# Patient Record
Sex: Male | Born: 1970 | Race: White | Hispanic: No | Marital: Married | Smoking: Never smoker
Health system: Southern US, Community
[De-identification: ages and names within clinical notes are randomized; demographics above are authoritative.]

## PROBLEM LIST (undated history)

## (undated) DIAGNOSIS — E7800 Pure hypercholesterolemia, unspecified: Secondary | ICD-10-CM

## (undated) DIAGNOSIS — R109 Unspecified abdominal pain: Secondary | ICD-10-CM

## (undated) DIAGNOSIS — N200 Calculus of kidney: Secondary | ICD-10-CM

## (undated) DIAGNOSIS — M5126 Other intervertebral disc displacement, lumbar region: Secondary | ICD-10-CM

## (undated) DIAGNOSIS — K8070 Calculus of gallbladder and bile duct without cholecystitis without obstruction: Secondary | ICD-10-CM

## (undated) DIAGNOSIS — Z6841 Body Mass Index (BMI) 40.0 and over, adult: Secondary | ICD-10-CM

## (undated) DIAGNOSIS — J45909 Unspecified asthma, uncomplicated: Secondary | ICD-10-CM

## (undated) DIAGNOSIS — K760 Fatty (change of) liver, not elsewhere classified: Secondary | ICD-10-CM

## (undated) DIAGNOSIS — K219 Gastro-esophageal reflux disease without esophagitis: Secondary | ICD-10-CM

## (undated) DIAGNOSIS — K2100 Gastro-esophageal reflux disease with esophagitis, without bleeding: Secondary | ICD-10-CM

## (undated) DIAGNOSIS — F32A Depression, unspecified: Secondary | ICD-10-CM

## (undated) DIAGNOSIS — Z87442 Personal history of urinary calculi: Secondary | ICD-10-CM

## (undated) DIAGNOSIS — D120 Benign neoplasm of cecum: Secondary | ICD-10-CM

## (undated) DIAGNOSIS — E119 Type 2 diabetes mellitus without complications: Secondary | ICD-10-CM

## (undated) DIAGNOSIS — G4733 Obstructive sleep apnea (adult) (pediatric): Secondary | ICD-10-CM

## (undated) DIAGNOSIS — J309 Allergic rhinitis, unspecified: Secondary | ICD-10-CM

## (undated) DIAGNOSIS — F329 Major depressive disorder, single episode, unspecified: Secondary | ICD-10-CM

## (undated) DIAGNOSIS — I10 Essential (primary) hypertension: Secondary | ICD-10-CM

## (undated) DIAGNOSIS — E669 Obesity, unspecified: Secondary | ICD-10-CM

## (undated) HISTORY — DX: Allergic rhinitis, unspecified: J30.9

## (undated) HISTORY — DX: Type 2 diabetes mellitus without complications: E11.9

## (undated) HISTORY — DX: Unspecified abdominal pain: R10.9

## (undated) HISTORY — PX: EXTRACORPOREAL SHOCK WAVE LITHOTRIPSY: SHX1557

## (undated) HISTORY — DX: Essential (primary) hypertension: I10

## (undated) HISTORY — DX: Pure hypercholesterolemia, unspecified: E78.00

## (undated) HISTORY — DX: Personal history of urinary calculi: Z87.442

## (undated) HISTORY — DX: Benign neoplasm of cecum: D12.0

## (undated) HISTORY — DX: Calculus of gallbladder and bile duct without cholecystitis without obstruction: K80.70

## (undated) HISTORY — DX: Major depressive disorder, single episode, unspecified: F32.9

## (undated) HISTORY — DX: Fatty (change of) liver, not elsewhere classified: K76.0

## (undated) HISTORY — DX: Other intervertebral disc displacement, lumbar region: M51.26

## (undated) HISTORY — DX: Depression, unspecified: F32.A

## (undated) HISTORY — DX: Body Mass Index (BMI) 40.0 and over, adult: Z684

## (undated) HISTORY — DX: Gastro-esophageal reflux disease with esophagitis, without bleeding: K21.00

## (undated) HISTORY — DX: Obstructive sleep apnea (adult) (pediatric): G47.33

## (undated) HISTORY — DX: Gastro-esophageal reflux disease with esophagitis: K21.0

## (undated) HISTORY — DX: Obesity, unspecified: E66.9

---

## 2009-08-14 ENCOUNTER — Encounter: Admission: RE | Admit: 2009-08-14 | Discharge: 2009-08-14 | Payer: Self-pay | Admitting: Family Medicine

## 2010-02-14 ENCOUNTER — Emergency Department (HOSPITAL_BASED_OUTPATIENT_CLINIC_OR_DEPARTMENT_OTHER): Admission: EM | Admit: 2010-02-14 | Discharge: 2010-02-14 | Payer: Self-pay | Admitting: Emergency Medicine

## 2010-03-04 ENCOUNTER — Emergency Department (HOSPITAL_BASED_OUTPATIENT_CLINIC_OR_DEPARTMENT_OTHER): Admission: EM | Admit: 2010-03-04 | Discharge: 2010-03-04 | Payer: Self-pay | Admitting: Emergency Medicine

## 2010-09-04 ENCOUNTER — Ambulatory Visit (HOSPITAL_COMMUNITY)
Admission: RE | Admit: 2010-09-04 | Discharge: 2010-09-04 | Payer: Self-pay | Source: Home / Self Care | Attending: Urology | Admitting: Urology

## 2010-09-04 LAB — GLUCOSE, CAPILLARY: Glucose-Capillary: 146 mg/dL — ABNORMAL HIGH (ref 70–99)

## 2010-09-04 LAB — BASIC METABOLIC PANEL
BUN: 11 mg/dL (ref 6–23)
CO2: 29 mEq/L (ref 19–32)
Calcium: 9.5 mg/dL (ref 8.4–10.5)
Chloride: 102 mEq/L (ref 96–112)
GFR calc Af Amer: >60 mL/min (ref >60)
Glucose, Bld: 147 mg/dL — ABNORMAL HIGH (ref 70–99)

## 2010-09-04 LAB — CBC
HCT: 41.1 % (ref 39.0–52.0)
Hemoglobin: 14.5 g/dL (ref 13.0–17.0)
Platelets: 195 10*3/uL (ref 150–400)
WBC: 6.9 10*3/uL (ref 4.0–10.5)

## 2014-08-09 ENCOUNTER — Encounter: Payer: Self-pay | Admitting: *Deleted

## 2014-08-13 ENCOUNTER — Ambulatory Visit (INDEPENDENT_AMBULATORY_CARE_PROVIDER_SITE_OTHER): Payer: 59 | Admitting: Neurology

## 2014-08-13 ENCOUNTER — Encounter: Payer: Self-pay | Admitting: Neurology

## 2014-08-13 VITALS — BP 130/90 | HR 81 | Resp 14 | Ht 70.25 in | Wt 299.0 lb

## 2014-08-13 DIAGNOSIS — G4733 Obstructive sleep apnea (adult) (pediatric): Secondary | ICD-10-CM

## 2014-08-13 DIAGNOSIS — E1129 Type 2 diabetes mellitus with other diabetic kidney complication: Secondary | ICD-10-CM

## 2014-08-13 DIAGNOSIS — Z9989 Dependence on other enabling machines and devices: Principal | ICD-10-CM

## 2014-08-13 DIAGNOSIS — E662 Morbid (severe) obesity with alveolar hypoventilation: Secondary | ICD-10-CM

## 2014-08-13 NOTE — Progress Notes (Addendum)
SLEEP MEDICINE CLINIC   Provider:  Larey Seat, M D  Referring Provider: No ref. provider found Primary Care Physician:  Phineas Inches, MD  Chief Complaint  Patient presents with  . NP [facility] Sleep Consult    Rm 10, alone    HPI:  Ivan Allison is a 44 y.o. male , married , full time [employer] broker.   He is seen here as a referral from Dr. Coletta Memos for a sleep apnea discussion.   [name] is a right-handed Caucasian gentleman that I saw last about 10 years ago. He was diagnosed with obstructive sleep apnea and given the same machine he is still using a ResMed escape device. He states that his diabetes has been treated in a different ways and he is losing some weight. He was already obese 10 years ago. He has not had any problems with CPAP use but not long ago his machine just switched off in the middle of the night. He has literally use that almost every night for the last 10 years. His compliance has been excellent. He would like however to update the CPAP therapy in his review of systems today he said that he still feels like he has decreased too much sleep that he is fatigued and that this may be not just related to apnea but to other conditions he's diagnosed with such as diabetes, hypertension, hypercholesterolemia and depression. He has no surgical history of any kind. And his medication list was already reviewed and will be part of this note in the next chapter. He does have occasional allergic rhinitis and takes Flonase. He endorsed today the Epworth sleepiness score at 6 points and the fatigue severity scale at 38 points.  He tends to go to bed late , about midnight, and promptly goes to sleep,  Sleeps for  9-11 hours if not woken. Since being on invokamet has more nocturia.   He relies on an alarm, feels not rested in relation to hours of sleep. For school he gets up at 7.30 AM .  Her therapy changed from a FFM and he likes a nasal pillow, P 10 Airfit.  Auto-set range is  unchanged.   The patient's download was obtained today in office and it shows for the last 30 days and AHI of 8.3, average daily usage of 9 hours 19 minutes 100% compliance for days and 4 days over 4 hours of use. Time in apnea was only 0.1% air leak at the 91st percentile was 33.6 L/m pressure at the 95th percentile was 7.4 cm water machine is set between 4 and 12 cm as the Ultracet. The residual AHI however is a little on the high side and may contribute to fatigue and sleepiness for this reason we will order a split study study to establish the new baseline apnea index and to treat accordingly.   Review of Systems: Out of a complete 14 system review, the patient complains of only the following symptoms, and all other reviewed systems are negative. Sleepiness, fatigue, snoring if not on CPAP. Obesity, Nocturia times 1-2.  Hydration is variable.   Epworth score 8 , Fatigue severity score 38  , depression score is not obtained.    History   Social History  . Marital Status: Married    Spouse Name: N/A    Number of Children: N/A  . Years of Education: N/A   Occupational History  . Not on file.   Social History Main Topics  . Smoking status: Never Smoker   .  Smokeless tobacco: Not on file  . Alcohol Use: 0.0 oz/week    0 Not specified per week     Comment: rarely  . Drug Use: No  . Sexual Activity: Not on file   Other Topics Concern  . Not on file   Social History Narrative   Caffeine 1 cup avg daily.   Student/[employer], Working on [place].  Married, no kids.      History reviewed. No pertinent family history.  Past Medical History  Diagnosis Date  . Hypertension   . Diabetes mellitus without complication   . Hypercholesterolemia   . OSA (obstructive sleep apnea)   . Reflux esophagitis   . Depression   . BMI 40.0-44.9, adult   . Displacement of lumbar intervertebral disc     History reviewed. No pertinent past surgical history.  Current Outpatient  Prescriptions  Medication Sig Dispense Refill  . ALPRAZolam (XANAX) 0.5 MG tablet Take 0.5 mg by mouth at bedtime as needed. For sleep    . aspirin EC 81 MG tablet Take 81 mg by mouth daily.    Marland Kitchen atorvastatin (LIPITOR) 40 MG tablet Take 40 mg by mouth daily.    Marland Kitchen buPROPion (WELLBUTRIN XL) 150 MG 24 hr tablet Take 150 mg by mouth daily.    . Cetirizine HCl (ZYRTEC ALLERGY) 10 MG CAPS Take 10 mg by mouth daily.    . cyclobenzaprine (FLEXERIL) 5 MG tablet Take 5 mg by mouth at bedtime as needed.    . diphenoxylate-atropine (LOMOTIL) 2.5-0.025 MG per tablet Take 1 tablet by mouth 4 (four) times daily as needed.    Marland Kitchen escitalopram (LEXAPRO) 10 MG tablet Take 10 mg by mouth daily.    . fluticasone (FLONASE) 50 MCG/ACT nasal spray Place 2 sprays into both nostrils daily.    Marland Kitchen lisinopril-hydrochlorothiazide (PRINZIDE,ZESTORETIC) 20-12.5 MG per tablet Take 2 tablets by mouth daily.    . meloxicam (MOBIC) 15 MG tablet Take 15 mg by mouth daily as needed for pain.    Marland Kitchen metformin (FORTAMET) 1000 MG (OSM) 24 hr tablet Take 1,000 mg by mouth 2 (two) times daily.    . montelukast (SINGULAIR) 10 MG tablet Take 10 mg by mouth daily.    . Multiple Vitamin (MULTIVITAMIN) tablet Take 1 tablet by mouth daily.    . Potassium Citrate 15 MEQ (1620 MG) TBCR Take 10 mEq by mouth daily.    . Testosterone (ANDROGEL PUMP) 20.25 MG/ACT (1.62%) GEL 5 g once daily.    Marland Kitchen Dexlansoprazole (DEXILANT) 30 MG capsule Take 30 mg by mouth daily.     No current facility-administered medications for this visit.    Allergies as of 08/13/2014 - Review Complete 08/13/2014  Allergen Reaction Noted  . Sulfamethoxazole-trimethoprim Hives 08/09/2014    Vitals: BP 130/90 mmHg  Pulse 81  Resp 14  Ht 5' 10.25" (1.784 m)  Wt 299 lb (135.626 kg)  BMI 42.61 kg/m2 Last Weight:  Wt Readings from Last 1 Encounters:  08/13/14 299 lb (135.626 kg)       Last Height:   Ht Readings from Last 1 Encounters:  08/13/14 5' 10.25" (1.784 m)     Physical exam:  General: The patient is awake, alert and appears not in acute distress. The patient is well groomed. Head: Normocephalic, atraumatic. Neck is supple. Mallampati 4 , macroglossia.   neck circumference: 18 . Nasal airflow restricted,TMJ is not evident. Retrognathia is not seen.  Cardiovascular:  Regular rate and rhythm , without  murmurs or carotid  bruit, and without distended neck veins. Respiratory: Lungs are clear to auscultation. Skin:  Without evidence of edema, or rash Trunk: BMI is elevated and patient  has normal posture.  Neurologic exam : The patient is awake and alert, oriented to place and time.   Memory subjective described as intact.  There is a normal attention span & concentration ability.  Speech is fluent without dysarthria, dysphonia or aphasia.  Mood and affect are appropriate.  Cranial nerves: Pupils are equal and briskly reactive to light. Funduscopic exam without  evidence of pallor or edema.  Extraocular movements  in vertical and horizontal planes intact and without nystagmus. Visual fields by finger perimetry are intact. Hearing to finger rub intact.  Facial sensation intact to fine touch. Facial motor strength is symmetric and tongue and uvula move midline.  Motor exam:  Normal tone, muscle bulk and symmetric, strength in all extremities.  Sensory:  Fine touch, pinprick and vibration were tested in all extremities.  Proprioception is tested in the upper extremities only. This was normal.  Coordination: Rapid alternating movements in the fingers/hands is normal.  Finger-to-nose maneuver  normal without evidence of ataxia, dysmetria or tremor.  Gait and station: Patient walks without assistive device and is able unassisted to climb up to the exam table. Strength within normal limits.  Stance is stable and normal. Tandem gait is unfragmented. Romberg testing is negative.  Deep tendon reflexes: in the  upper and lower extremities are  symmetric and intact.  Babinski maneuver response is downgoing.   Assessment:  After physical and neurologic examination, review of laboratory studies, imaging, neurophysiology testing and pre-existing records, assessment is   1) OSA with CPAP use, highly compliant. 45 year old machine now unreliable. Autoset machine.  2) obesity - no headaches , no eye pressure, had both before CPAP use.  Nocturia- related to diabetes.   The patient was advised of the nature of the diagnosed sleep disorder, the treatment options and risks for general a health and wellness arising from not treating the condition. Visit duration was 45  minutes.   Plan:  Treatment plan and additional workup :  1) Repeat SPLIT study with the ultimate goal to get a respironics dream work machine with autoset function.  2) [employer] patient 4% AHI spit at 15, CO2 .       Asencion Partridge Geraldine Tesar MD  08/13/2014

## 2014-08-13 NOTE — Patient Instructions (Signed)
Polysomnography (Sleep Studies) Polysomnography (PSG) is a series of tests used for detecting (diagnosing) obstructive sleep apnea and other sleep disorders. The tests measure how some parts of your body are working while you are sleeping. The tests are extensive and expensive. They are done in a sleep lab or hospital, and vary from center to center. Your caregiver may perform other more simple sleep studies and questionnaires before doing more complete and involved testing. Testing may not be covered by insurance. Some of these tests are:  An EEG (Electroencephalogram). This tests your brain waves and stages of sleep.  An EOG (Electrooculogram). This measures the movements of your eyes. It detects periods of REM (rapid eye movement) sleep, which is your dream sleep.  An EKG (Electrocardiogram). This measures your heart rhythm.  EMG (Electromyography). This is a measurement of how the muscles are working in your upper airway and your legs while sleeping.  An oximetry measurement. It measures how much oxygen (air) you are getting while sleeping.  Breathing efforts may be measured. The same test can be interpreted (understood) differently by different caregivers and centers that study sleep.  Studies may be given an apnea/hypopnea index (AHI). This is a number which is found by counting the times of no breathing or under breathing during the night, and relating those numbers to the amount of time spent in bed. When the AHI is greater than 15, the patient is likely to complain of daytime sleepiness. When the AHI is greater than 30, the patient is at increased risk for heart problems and must be followed more closely. Following the AHI also allows you to know how treatment is working. Simple oximetry (tracking the amount of oxygen that is taken in) can be used for screening patients who:  Do not have symptoms (problems) of OSA.  Have a normal Epworth Sleepiness Scale Score.  Have a low pre-test  probability of having OSA.  Have none of the upper airway problems likely to cause apnea.  Oximetry is also used to determine if treatment is effective in patients who showed significant desaturations (not getting enough oxygen) on their home sleep study. One extra measure of safety is to perform additional studies for the person who only snores. This is because no one can predict with absolute certainty who will have OSA. Those who show significant desaturations (not getting enough oxygen) are recommended to have a more detailed sleep study. Document Released: 01/31/2003 Document Revised: 10/19/2011 Document Reviewed: 10/02/2013 ExitCare Patient Information 2015 [employer]. This information is not intended to replace advice given to you by your health care provider. Make sure you discuss any questions you have with your health care provider.  

## 2014-08-15 ENCOUNTER — Ambulatory Visit (INDEPENDENT_AMBULATORY_CARE_PROVIDER_SITE_OTHER): Payer: 59 | Admitting: Neurology

## 2014-08-15 DIAGNOSIS — Z9989 Dependence on other enabling machines and devices: Principal | ICD-10-CM

## 2014-08-15 DIAGNOSIS — E1129 Type 2 diabetes mellitus with other diabetic kidney complication: Secondary | ICD-10-CM

## 2014-08-15 DIAGNOSIS — E662 Morbid (severe) obesity with alveolar hypoventilation: Secondary | ICD-10-CM

## 2014-08-15 DIAGNOSIS — G4733 Obstructive sleep apnea (adult) (pediatric): Secondary | ICD-10-CM

## 2014-08-16 NOTE — Sleep Study (Signed)
Please see the scanned sleep study interpretation located in the Procedure tab within the Chart Review section. 

## 2014-08-27 ENCOUNTER — Telehealth: Payer: Self-pay | Admitting: *Deleted

## 2014-08-27 NOTE — Telephone Encounter (Signed)
Patient calling for sleep lab results, which are not available, patient would either like to have an appointment or request a call with results.

## 2014-08-28 ENCOUNTER — Other Ambulatory Visit: Payer: Self-pay | Admitting: Neurology

## 2014-08-28 ENCOUNTER — Encounter: Payer: Self-pay | Admitting: Neurology

## 2014-08-28 ENCOUNTER — Telehealth: Payer: Self-pay | Admitting: *Deleted

## 2014-08-28 ENCOUNTER — Encounter: Payer: Self-pay | Admitting: *Deleted

## 2014-08-28 DIAGNOSIS — G4733 Obstructive sleep apnea (adult) (pediatric): Secondary | ICD-10-CM

## 2014-08-28 NOTE — Telephone Encounter (Signed)
Patient was contacted and provided the results of his split night sleep study.  Patient has a prior diagnosis of sleep apnea and is in need of new CPAP equipment.  Patient requested [employer] and was referred to their office.  The patient gave verbal permission to mail a copy of his test results.  Dr. Bernerd Limbo was faxed a copy of the report.   Patient instructed to contact our office 6-8 weeks post set up to schedule a follow up appointment.

## 2014-08-28 NOTE — Telephone Encounter (Signed)
Patient was contacted today with results and referred to DME for CPAP set up.

## 2014-10-22 ENCOUNTER — Encounter: Payer: Self-pay | Admitting: Neurology

## 2014-10-22 ENCOUNTER — Ambulatory Visit (INDEPENDENT_AMBULATORY_CARE_PROVIDER_SITE_OTHER): Payer: 59 | Admitting: Neurology

## 2014-10-22 VITALS — BP 129/83 | HR 78 | Resp 14 | Ht 69.0 in | Wt 305.6 lb

## 2014-10-22 DIAGNOSIS — Z9989 Dependence on other enabling machines and devices: Principal | ICD-10-CM

## 2014-10-22 DIAGNOSIS — G4733 Obstructive sleep apnea (adult) (pediatric): Secondary | ICD-10-CM | POA: Insufficient documentation

## 2014-10-22 NOTE — Progress Notes (Signed)
SLEEP MEDICINE CLINIC   Provider:  Larey Seat, M D  Referring Provider: Bernerd Limbo, MD Primary Care Physician:  Phineas Inches, MD  Chief Complaint  Patient presents with  . 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   He is seen here as a referral from Dr. Coletta Memos for a sleep apnea discussion.   Ivan Allison is a right-handed Caucasian gentleman that I saw last about 10 years ago. He was diagnosed with obstructive sleep apnea and given the same machine he is still using a ResMed escape device. He states that his diabetes has been treated in a different ways and he is losing some weight. He was already obese 10 years ago. He has not had any problems with CPAP use but not long ago his machine just switched off in the middle of the night. He has literally use that almost every night for the last 10 years. His compliance has been excellent. He would like however to update the CPAP therapy in his review of systems today he said that he still feels like he has decreased too much sleep that he is fatigued and that this may be not just related to apnea but to other conditions he's diagnosed with such as diabetes, hypertension, hypercholesterolemia and depression. He has no surgical history of any kind. And his medication list was already reviewed and will be part of this note in the next chapter. He does have occasional allergic rhinitis and takes Flonase. He endorsed today the Epworth sleepiness score at 6 points and the fatigue severity scale at 38 points.  He tends to go to bed late , about midnight, and promptly goes to sleep,  Sleeps for  9-11 hours if not woken. Since being on invokamet has more nocturia.   He relies on an alarm, feels not rested in relation to hours of sleep. For school he gets up at 7.30 AM .  Her therapy changed from a FFM and he likes a nasal pillow, P 10 Airfit.  Auto-set range is unchanged.  The patient's  download was obtained today in office and it shows for the last 30 days and AHI of 8.3, average daily usage of 9 hours 19 minutes 100% compliance for days and 4 days over 4 hours of use. Time in apnea was only 0.1% air leak at the 91st percentile was 33.6 L/m pressure at the 95th percentile was 7.4 cm water machine is set between 4 and 12 cm as the Ultracet. The residual AHI however is a little on the high side and may contribute to fatigue and sleepiness for this reason we will order a split study study to establish the new baseline apnea index and to treat accordingly.   Interval history Ivan Allison underwent a new titration study :  Dated 08-15-14. The patient had been diagnosed with obstructive sleep apnea apnea in the past and was occurrence CPAP user. Interestingly for someone was used to the CPAP he had a sleep latency of 145 minutes. His AHI was 37.2 RDI 38.9 no REM sleep was seen in the diagnostic study. CPAP was initiated at 5 cm water and step-by-step increased to 13. It appeared that 12 cm water was best tolerated but there was not a lot of time witnessed under that pressure setting. For this reason the patient was prescribed a CPAP of 10 by ResMed. 12 cm water pressure 3  cm EPR or flex function and an air-fluid P 10 mask. He likes to set the machine is quiet he likes it looks and its handedness he has a heated holes which avoids conization water collection and the interface is very comfortable to him. The patient does have facial hair.  Compliance report for the last 30 days until 10-18-14 the patient used to machine 100% of all nights and 100% of all night over 4 hours consecutively. His average usage is 8 hours 47 minutes, the machine is set between 8 and 12 cm water pressure as an AutoSet with 2 cm EPR and his AHI is 2.7. He does not have major air leaks is 91st percentile pressure is around 11 cm I will not set the machine is not on have to. Today's Epworth sleepiness score was endorsed at 2  points and fatigue severity score at 22 points both are significantly reduced pre-study Epworth was 8 points and fatigue was 38 points.  Review of Systems: Out of a complete 14 system review, the patient complains of only the following symptoms, and all other reviewed systems are negative. Sleepiness, fatigue, snoring if not on CPAP.  Obesity, Nocturia times : on invokana ,  Zero to one time at night   Epworth score 2 from 8 , Fatigue severity score 22 from  38  , depression score is  2.    History   Social History  . Marital Status: Married    Spouse Name: N/A  . Number of Children: N/A  . Years of Education: N/A   Occupational History  . Not on file.   Social History Main Topics  . Smoking status: Never Smoker   . Smokeless tobacco: Not on file  . Alcohol Use: 0.0 oz/week    0 Standard drinks or equivalent per week     Comment: rarely  . Drug Use: No  . Sexual Activity: Not on file   Other Topics Concern  . Not on file   Social History Narrative   Caffeine 1 cup avg daily.   Student/[employer], Working on [place].  Married, no kids.      History reviewed. No pertinent family history.  Past Medical History  Diagnosis Date  . Hypertension   . Diabetes mellitus without complication   . Hypercholesterolemia   . OSA (obstructive sleep apnea)   . Reflux esophagitis   . Depression   . BMI 40.0-44.9, adult   . Displacement of lumbar intervertebral disc     History reviewed. No pertinent past surgical history. Vitals: BP 129/83 mmHg  Pulse 78  Resp 14  Ht 5\' 9"  (1.753 m)  Wt 305 lb 9.6 oz (138.619 kg)  BMI 45.11 kg/m2 Last Weight:  Wt Readings from Last 1 Encounters:  10/22/14 305 lb 9.6 oz (138.619 kg)       Last Height:   Ht Readings from Last 1 Encounters:  10/22/14 5\' 9"  (1.753 m)    Physical exam:  General: The patient is awake, alert and appears not in acute distress. The patient is well groomed. Head: Normocephalic, atraumatic. Neck is supple.  Mallampati 4, macroglossia.   Facial hair.  neck circumference: 18 . Nasal airflow not longer  Restricted, has flonase.  TMJ is not evident.  Retrognathia is not seen.  Cardiovascular:  Regular rate and rhythm , without  murmurs or carotid bruit, and without distended neck veins. Respiratory: Lungs are clear to auscultation. Skin:  Without evidence of edema, or rash Trunk: BMI is  elevated /  normal posture.  Neurologic exam : The patient is awake and alert, oriented to place and time.   Memory subjective described as intact.  There is a normal attention span & concentration ability.  Speech is fluent without dysarthria, dysphonia or aphasia.  Mood and affect are appropriate.  Cranial nerves: Pupils are equal and briskly reactive to light. Hearing to finger rub intact.  Facial sensation intact to fine touch. Facial motor strength is symmetric and tongue and uvula move midline.  Motor exam:  Normal tone, muscle bulk and symmetric, strength in all extremities.  Sensory:  Fine touch, pinprick and vibration were tested in all extremities.  Proprioception is tested in the upper extremities only. This was normal.  Coordination: Rapid alternating movements in the fingers/hands is normal.  Finger-to-nose maneuver  normal without evidence of ataxia, dysmetria or tremor.  Gait and station: Patient walks without assistive device and is able unassisted to climb up to the exam table. Strength within normal limits.  Stance is stable and normal. Tandem gait is unfragmented. Romberg testing is negative.  Deep tendon reflexes: in the  upper and lower extremities are symmetric and intact.  Babinski maneuver response is downgoing.   Assessment:  After physical and neurologic examination, review of laboratory studies, imaging, neurophysiology testing and pre-existing records, assessment is   1) OSA with CPAP use, highly compliant. New machine and interface P 10 comfortable  Autoset machine 8-12 , 95%  pressure is 11 cm water. Marland Kitchen  2) obesity -  3)Nocturia- residual zero to one related to diabetes. He has 2-3 before , and this improved  under CPAP.   The patient was advised of the nature of the diagnosed sleep disorder, the treatment options and risks for general a health and wellness arising from not treating the condition. Visit duration was 45  minutes.   Plan:  Treatment plan and additional workup :  1) [employer] patient with APRIA, on S 10 ResMed with airfit p10 , follow up yearly.   Asencion Partridge Anjolaoluwa Siguenza MD  10/22/2014

## 2014-10-22 NOTE — Patient Instructions (Signed)

## 2014-10-23 ENCOUNTER — Encounter: Payer: Self-pay | Admitting: Neurology

## 2014-11-30 ENCOUNTER — Encounter: Payer: Self-pay | Admitting: Neurology

## 2015-05-20 ENCOUNTER — Other Ambulatory Visit: Payer: Self-pay | Admitting: Surgical Oncology

## 2015-05-20 DIAGNOSIS — K219 Gastro-esophageal reflux disease without esophagitis: Secondary | ICD-10-CM

## 2015-05-20 DIAGNOSIS — E119 Type 2 diabetes mellitus without complications: Secondary | ICD-10-CM

## 2015-05-20 DIAGNOSIS — G4733 Obstructive sleep apnea (adult) (pediatric): Secondary | ICD-10-CM

## 2015-05-20 DIAGNOSIS — I159 Secondary hypertension, unspecified: Secondary | ICD-10-CM

## 2015-05-22 ENCOUNTER — Ambulatory Visit
Admission: RE | Admit: 2015-05-22 | Discharge: 2015-05-22 | Disposition: A | Discharge status: Home / Self Care | Payer: 59 | Source: Ambulatory Visit | Attending: Surgical Oncology | Admitting: Surgical Oncology

## 2015-05-22 DIAGNOSIS — K219 Gastro-esophageal reflux disease without esophagitis: Secondary | ICD-10-CM

## 2015-05-22 DIAGNOSIS — G4733 Obstructive sleep apnea (adult) (pediatric): Secondary | ICD-10-CM

## 2015-05-22 DIAGNOSIS — E119 Type 2 diabetes mellitus without complications: Secondary | ICD-10-CM

## 2015-05-22 DIAGNOSIS — I159 Secondary hypertension, unspecified: Secondary | ICD-10-CM

## 2015-10-22 ENCOUNTER — Ambulatory Visit (INDEPENDENT_AMBULATORY_CARE_PROVIDER_SITE_OTHER): Payer: 59 | Admitting: Neurology

## 2015-10-22 ENCOUNTER — Encounter: Payer: Self-pay | Admitting: Neurology

## 2015-10-22 VITALS — BP 122/82 | HR 88 | Resp 20 | Ht 69.0 in | Wt 288.0 lb

## 2015-10-22 DIAGNOSIS — E089 Diabetes mellitus due to underlying condition without complications: Secondary | ICD-10-CM

## 2015-10-22 DIAGNOSIS — G4733 Obstructive sleep apnea (adult) (pediatric): Secondary | ICD-10-CM

## 2015-10-22 DIAGNOSIS — Z9989 Dependence on other enabling machines and devices: Principal | ICD-10-CM

## 2015-10-22 NOTE — Progress Notes (Signed)
[facility]   Provider:  Larey Seat, M D  Referring Provider: Bernerd Limbo, MD Primary Care Physician:  Phineas Inches, MD  Chief Complaint  Patient presents with  . Follow-up    cpap, going well, uses apria, rm 11, alone    HPI:  Ivan Allison is a 45 y.o. male , married , full time [employer].   He is seen here as a referral from Dr. Coletta Memos for a sleep apnea discussion.   [name] is a right-handed Caucasian gentleman that I saw last about 10 years ago. He was diagnosed with obstructive sleep apnea and given the same machine he is still using a ResMed escape device. He states that his diabetes has been treated in a different ways and he is losing some weight. He was already obese 10 years ago. He has not had any problems with CPAP use but not long ago his machine just switched off in the middle of the night. He has literally use that almost every night for the last 10 years. His compliance has been excellent. He would like however to update the CPAP therapy in his review of systems today he said that he still feels like he has decreased too much sleep that he is fatigued and that this may be not just related to apnea but to other conditions he's diagnosed with such as diabetes, hypertension, hypercholesterolemia and depression. He has no surgical history of any kind. And his medication list was already reviewed and will be part of this note in the next chapter. He does have occasional allergic rhinitis and takes Flonase. He endorsed today the Epworth sleepiness score at 6 points and the fatigue severity scale at 38 points.  He tends to go to bed late , about midnight, and promptly goes to sleep,  Sleeps for  9-11 hours if not woken. Since being on invokamet has more nocturia.   He relies on an alarm, feels not rested in relation to hours of sleep. For school he gets up at 7.30 AM .  Her therapy changed from a FFM and he likes a nasal pillow, P 10 Airfit.  Auto-set range  is unchanged.  The patient's download was obtained today in office and it shows for the last 30 days and AHI of 8.3, average daily usage of 9 hours 19 minutes 100% compliance for days and 4 days over 4 hours of use. Time in apnea was only 0.1% air leak at the 91st percentile was 33.6 L/m pressure at the 95th percentile was 7.4 cm water machine is set between 4 and 12 cm as the Ultracet. The residual AHI however is a little on the high side and may contribute to fatigue and sleepiness for this reason we will order a split study study to establish the new baseline apnea index and to treat accordingly.   Interval history [name] underwent a new titration study :  Dated 08-15-14. The patient had been diagnosed with obstructive sleep apnea apnea in the past and was occurrence CPAP user. Interestingly for someone was used to the CPAP he had a sleep latency of 145 minutes. His AHI was 37.2 RDI 38.9 no REM sleep was seen in the diagnostic study. CPAP was initiated at 5 cm water and step-by-step increased to 13. It appeared that 12 cm water was best tolerated but there was not a lot of time witnessed under that pressure setting. For this reason the patient was prescribed a CPAP of 10 by ResMed. 12  cm water pressure 3 cm EPR or flex function and an air-fluid P 10 mask. He likes to set the machine is quiet he likes it looks and its handedness he has a heated holes which avoids conization water collection and the interface is very comfortable to him. The patient does have facial hair.  Compliance report for the last 30 days until 10-18-14 the patient used to machine 100% of all nights and 100% of all night over 4 hours consecutively. His average usage is 8 hours 47 minutes, the machine is set between 8 and 12 cm water pressure as an AutoSet with 2 cm EPR and his AHI is 2.7. He does not have major air leaks is 91st percentile pressure is around 11 cm I will not set the machine is not on have to. Today's Epworth  sleepiness score was endorsed at 2 points and fatigue severity score at 22 points both are significantly reduced pre-study Epworth was 8 points and fatigue was 38 points.   10-22-15 [name] underwent a gastric sleeve surgery with Dr. Toney Rakes and is doing well, her husband, my patient, is taken with the idea to undergo a similar procedure. He is using Invokana and lost 12 pounds.  [name] has used his machine 100% of the time over 4 hours every day of the last 30 days. Compliance is therefore 100% average user time 8 hours 37 minutes, he is using an AutoSet between 8 and 12 cm water with 2 cm EPR. Residual AHI is 2.0 there is no major air leak the 95th 02 percentile pressure is 11.7 cm water.     Review of Systems: Out of a complete 14 system review, the patient complains of only the following symptoms, and all other reviewed systems are negative. Sleepiness, fatigue, snoring if not on CPAP.  Obesity, Nocturia times : on invokana ,  Zero to one time at night   Epworth score 2 from 2 ,  Fatigue severity score 22 from  38  , depression score is  2.    Social History   Social History  . Marital Status: Married    Spouse Name: N/A  . Number of Children: N/A  . Years of Education: N/A   Occupational History  . Not on file.   Social History Main Topics  . Smoking status: Never Smoker   . Smokeless tobacco: Not on file  . Alcohol Use: 0.0 oz/week    0 Standard drinks or equivalent per week     Comment: rarely  . Drug Use: No  . Sexual Activity: Not on file   Other Topics Concern  . Not on file   Social History Narrative   Caffeine 1 cup avg daily.   Student/[employer], Working on [employer].  Married, no kids.      History reviewed. No pertinent family history.  Past Medical History  Diagnosis Date  . Hypertension   . Diabetes mellitus without complication ([place])   . Hypercholesterolemia   . OSA (obstructive sleep apnea)   . Reflux esophagitis   . Depression   .  BMI 40.0-44.9, adult ([place])   . Displacement of lumbar intervertebral disc     History reviewed. No pertinent past surgical history. Vitals: BP 122/82 mmHg  Pulse 88  Resp 20  Ht 5\' 9"  (1.753 m)  Wt 288 lb (130.636 kg)  BMI 42.51 kg/m2 Last Weight:  Wt Readings from Last 1 Encounters:  10/22/15 288 lb (130.636 kg)  Last Height:   Ht Readings from Last 1 Encounters:  10/22/15 5\' 9"  (1.753 m)    Physical exam:  General: The patient is awake, alert and appears not in acute distress. The patient is well groomed. Head: Normocephalic, atraumatic. Neck is supple. Mallampati 4, macroglossia.   Facial hair.  neck circumference: 18 . Nasal airflow not longer  Restricted, has flonase.  TMJ is not evident.  Retrognathia is not seen.  Cardiovascular:  Regular rate and rhythm , without  murmurs or carotid bruit, and without distended neck veins. Respiratory: Lungs are clear to auscultation. Skin:  Without evidence of edema, or rash Trunk: BMI is elevated /  normal posture.  Neurologic exam : The patient is awake and alert, oriented to place and time.   Memory subjective described as intact.  There is a normal attention span & concentration ability.  Speech is fluent without dysarthria, dysphonia or aphasia.  Mood and affect are appropriate.  Cranial nerves: Pupils are equal and briskly reactive to light. Hearing to finger rub intact.  Facial sensation intact to fine touch. Facial motor strength is symmetric and tongue and uvula move midline.  Assessment:  After physical and neurologic examination, review of laboratory studies, imaging, neurophysiology testing and pre-existing records, assessment is   1) OSA with CPAP use, highly compliant. New machine and interface P 10 comfortable  Autoset machine 8-12 , 95% pressure is 11 cm water. Marland Kitchen  2) obesity -  3)Nocturia- residual zero to one related to diabetes. He has 2-3 before , and this improved  under CPAP.   The patient was  advised of the nature of the diagnosed sleep disorder, the treatment options and risks for general a health and wellness arising from not treating the condition. Visit duration was 45  minutes.   Plan:  Treatment plan and additional workup :  1) [employer] patient with APRIA, on ResMed S 10 ResMed with airfit P10 , follow up yearly. He established 100 % compliance  Next follow up with NP. He will bring his wife to the next appointment.   Asencion Partridge Hilma Steinhilber MD  10/22/2015

## 2015-10-22 NOTE — Patient Instructions (Signed)

## 2015-10-29 ENCOUNTER — Encounter: Payer: Self-pay | Admitting: *Deleted

## 2016-07-10 DIAGNOSIS — N200 Calculus of kidney: Secondary | ICD-10-CM

## 2016-07-10 HISTORY — DX: Calculus of kidney: N20.0

## 2016-07-20 ENCOUNTER — Other Ambulatory Visit: Payer: Self-pay | Admitting: Urology

## 2016-07-21 ENCOUNTER — Encounter (HOSPITAL_COMMUNITY): Payer: Self-pay | Admitting: *Deleted

## 2016-07-23 ENCOUNTER — Ambulatory Visit (HOSPITAL_COMMUNITY): Admission: RE | Admit: 2016-07-23 | Payer: 59 | Source: Ambulatory Visit | Admitting: Urology

## 2016-07-23 HISTORY — DX: Calculus of kidney: N20.0

## 2016-07-23 HISTORY — DX: Unspecified asthma, uncomplicated: J45.909

## 2016-07-23 HISTORY — DX: Pure hypercholesterolemia, unspecified: E78.00

## 2016-07-23 HISTORY — DX: Gastro-esophageal reflux disease without esophagitis: K21.9

## 2016-07-23 SURGERY — LITHOTRIPSY, ESWL
Anesthesia: LOCAL | Laterality: Left

## 2016-10-21 ENCOUNTER — Ambulatory Visit: Payer: 59 | Admitting: Neurology

## 2017-02-16 ENCOUNTER — Other Ambulatory Visit: Payer: Self-pay | Admitting: Surgical Oncology

## 2017-02-16 DIAGNOSIS — R945 Abnormal results of liver function studies: Principal | ICD-10-CM

## 2017-02-16 DIAGNOSIS — R7989 Other specified abnormal findings of blood chemistry: Secondary | ICD-10-CM

## 2017-02-22 ENCOUNTER — Ambulatory Visit
Admission: RE | Admit: 2017-02-22 | Discharge: 2017-02-22 | Disposition: A | Discharge status: Home / Self Care | Payer: 59 | Source: Ambulatory Visit | Attending: Surgical Oncology | Admitting: Surgical Oncology

## 2017-02-22 DIAGNOSIS — R7989 Other specified abnormal findings of blood chemistry: Secondary | ICD-10-CM

## 2017-02-22 DIAGNOSIS — R945 Abnormal results of liver function studies: Principal | ICD-10-CM

## 2019-08-11 HISTORY — PX: GALLBLADDER SURGERY: SHX652

## 2020-08-10 HISTORY — PX: APPENDECTOMY: SHX54

## 2020-08-23 ENCOUNTER — Other Ambulatory Visit: Payer: Self-pay | Admitting: Nurse Practitioner

## 2020-08-23 DIAGNOSIS — K635 Polyp of colon: Secondary | ICD-10-CM

## 2020-08-23 DIAGNOSIS — K8050 Calculus of bile duct without cholangitis or cholecystitis without obstruction: Secondary | ICD-10-CM

## 2020-09-02 ENCOUNTER — Ambulatory Visit
Admission: RE | Admit: 2020-09-02 | Discharge: 2020-09-02 | Disposition: A | Discharge status: Home / Self Care | Payer: 59 | Source: Ambulatory Visit | Attending: Nurse Practitioner | Admitting: Nurse Practitioner

## 2020-09-02 DIAGNOSIS — K635 Polyp of colon: Secondary | ICD-10-CM

## 2020-09-02 DIAGNOSIS — K8050 Calculus of bile duct without cholangitis or cholecystitis without obstruction: Secondary | ICD-10-CM

## 2020-09-02 MED ORDER — IOPAMIDOL (ISOVUE-300) INJECTION 61%
100.0000 mL | Freq: Once | INTRAVENOUS | Status: AC | PRN
  Administered 2020-09-02: 100 mL via INTRAVENOUS

## 2021-02-19 ENCOUNTER — Encounter: Payer: Self-pay | Admitting: Neurology

## 2021-02-20 ENCOUNTER — Institutional Professional Consult (permissible substitution): Payer: 59 | Admitting: Neurology

## 2021-02-24 DIAGNOSIS — Z0289 Encounter for other administrative examinations: Secondary | ICD-10-CM

## 2021-04-04 ENCOUNTER — Other Ambulatory Visit: Payer: Self-pay | Admitting: General Surgery

## 2021-04-07 ENCOUNTER — Other Ambulatory Visit: Payer: Self-pay | Admitting: General Surgery

## 2021-04-07 DIAGNOSIS — R590 Localized enlarged lymph nodes: Secondary | ICD-10-CM

## 2021-04-24 ENCOUNTER — Inpatient Hospital Stay: Admission: RE | Admit: 2021-04-24 | Payer: 59 | Source: Ambulatory Visit

## 2021-05-05 ENCOUNTER — Ambulatory Visit
Admission: RE | Admit: 2021-05-05 | Discharge: 2021-05-05 | Disposition: A | Discharge status: Home / Self Care | Payer: 59 | Source: Ambulatory Visit | Attending: General Surgery | Admitting: General Surgery

## 2021-05-05 DIAGNOSIS — R590 Localized enlarged lymph nodes: Secondary | ICD-10-CM

## 2021-05-05 MED ORDER — IOPAMIDOL (ISOVUE-300) INJECTION 61%
100.0000 mL | Freq: Once | INTRAVENOUS | Status: AC | PRN
  Administered 2021-05-05: 100 mL via INTRAVENOUS

## 2021-05-14 ENCOUNTER — Encounter: Payer: Self-pay | Admitting: Neurology

## 2021-05-14 ENCOUNTER — Ambulatory Visit (INDEPENDENT_AMBULATORY_CARE_PROVIDER_SITE_OTHER): Payer: 59 | Admitting: Neurology

## 2021-05-14 VITALS — BP 129/87 | HR 84 | Ht 69.0 in | Wt 279.5 lb

## 2021-05-14 DIAGNOSIS — Z6841 Body Mass Index (BMI) 40.0 and over, adult: Secondary | ICD-10-CM | POA: Diagnosis not present

## 2021-05-14 DIAGNOSIS — G4733 Obstructive sleep apnea (adult) (pediatric): Secondary | ICD-10-CM

## 2021-05-14 DIAGNOSIS — Z9989 Dependence on other enabling machines and devices: Secondary | ICD-10-CM

## 2021-05-14 NOTE — Patient Instructions (Signed)
CPAP and BPAP Information CPAP and BPAP (also called BiPAP) are methods that use air pressure to keep your airways open and to help you breathe well. CPAP and BPAP use different amounts of pressure. Your health care provider will tell you whether CPAP or BPAP would be more helpful for you. CPAP stands for "continuous positive airway pressure." With CPAP, the amount of pressure stays the same while you breathe in (inhale) and out (exhale). BPAP stands for "bi-level positive airway pressure." With BPAP, the amount of pressure will be higher when you inhale and lower when you exhale. This allows you to take larger breaths. CPAP or BPAP may be used in the hospital, or your health care provider may want you to use it at home. You may need to have a sleep study before your health care provider can order a machine for you to use at home. What are the advantages? CPAP or BPAP can be helpful if you have: Sleep apnea. Chronic obstructive pulmonary disease (COPD). Heart failure. Medical conditions that cause muscle weakness, including muscular dystrophy or amyotrophic lateral sclerosis (ALS). Other problems that cause breathing to be shallow, weak, abnormal, or difficult. CPAP and BPAP are most commonly used for obstructive sleep apnea (OSA) to keep the airways from collapsing when the muscles relax during sleep. What are the risks? Generally, this is a safe treatment. However, problems may occur, including: Irritated skin or skin sores if the mask does not fit properly. Dry or stuffy nose or nosebleeds. Dry mouth. Feeling gassy or bloated. Sinus or lung infection if the equipment is not cleaned properly. When should CPAP or BPAP be used? In most cases, the mask only needs to be worn during sleep. Generally, the mask needs to be worn throughout the night and during any daytime naps. People with certain medical conditions may also need to wear the mask at other times, such as when they are awake. Follow  instructions from your health care provider about when to use the machine. What happens during CPAP or BPAP? Both CPAP and BPAP are provided by a small machine with a flexible plastic tube that attaches to a plastic mask that you wear. Air is blown through the mask into your nose or mouth. The amount of pressure that is used to blow the air can be adjusted on the machine. Your health care provider will set the pressure setting and help you find the best mask for you. Tips for using the mask Because the mask needs to be snug, some people feel trapped or closed-in (claustrophobic) when first using the mask. If you feel this way, you may need to get used to the mask. One way to do this is to hold the mask loosely over your nose or mouth and then gradually apply the mask more snugly. You can also gradually increase the amount of time that you use the mask. Masks are available in various types and sizes. If your mask does not fit well, talk with your health care provider about getting a different one. Some common types of masks include: Full face masks, which fit over the mouth and nose. Nasal masks, which fit over the nose. Nasal pillow or prong masks, which fit into the nostrils. If you are using a mask that fits over your nose and you tend to breathe through your mouth, a chin strap may be applied to help keep your mouth closed. Use a skin barrier to protect your skin as told by your health care provider.   Some CPAP and BPAP machines have alarms that may sound if the mask comes off or develops a leak. If you have trouble with the mask, it is very important that you talk with your health care provider about finding a way to make the mask easier to tolerate. Do not stop using the mask. There could be a negative impact on your health if you stop using the mask. Tips for using the machine Place your CPAP or BPAP machine on a secure table or stand near an electrical outlet. Know where the on/off switch is on  the machine. Follow instructions from your health care provider about how to set the pressure on your machine and when you should use it. Do not eat or drink while the CPAP or BPAP machine is on. Food or fluids could get pushed into your lungs by the pressure of the CPAP or BPAP. For home use, CPAP and BPAP machines can be rented or purchased through home health care companies. Many different brands of machines are available. Renting a machine before purchasing may help you find out which particular machine works well for you. Your health insurance company may also decide which machine you may get. Keep the CPAP or BPAP machine and attachments clean. Ask your health care provider for specific instructions. Check the humidifier if you have a dry stuffy nose or nosebleeds. Make sure it is working correctly. Follow these instructions at home: Take over-the-counter and prescription medicines only as told by your health care provider. Ask if you can take sinus medicine if your sinuses are blocked. Do not use any products that contain nicotine or tobacco. These products include cigarettes, chewing tobacco, and vaping devices, such as e-cigarettes. If you need help quitting, ask your health care provider. Keep all follow-up visits. This is important. Contact a health care provider if: You have redness or pressure sores on your head, face, mouth, or nose from the mask or head gear. You have trouble using the CPAP or BPAP machine. You cannot tolerate wearing the CPAP or BPAP mask. Someone tells you that you snore even when wearing your CPAP or BPAP. Get help right away if: You have trouble breathing. You feel confused. Summary CPAP and BPAP are methods that use air pressure to keep your airways open and to help you breathe well. If you have trouble with the mask, it is very important that you talk with your health care provider about finding a way to make the mask easier to tolerate. Do not stop using the  mask. There could be a negative impact to your health if you stop using the mask. Follow instructions from your health care provider about when to use the machine. This information is not intended to replace advice given to you by your health care provider. Make sure you discuss any questions you have with your health care provider. Document Revised: 07/05/2020 Document Reviewed: 07/05/2020 Elsevier Patient Education  2022 [employer] Calories are units of energy. Your body needs a certain number of calories from food to keep going throughout the day. When you eat or drink more calories than your body needs, your body stores the extra calories mostly as fat. When you eat or drink fewer calories than your body needs, your body burns fat to get the energy it needs. Calorie counting means keeping track of how many calories you eat and drink each day. Calorie counting can be helpful if you need to lose weight. If  you eat fewer calories than your body needs, you should lose weight. Ask your health care provider what a healthy weight is for you. For calorie counting to work, you will need to eat the right number of calories each day to lose a healthy amount of weight per week. A dietitian can help you figure out how many calories you need in a day and will suggest ways to reach your calorie goal. A healthy amount of weight to lose each week is usually 1-2 lb (0.5-0.9 kg). This usually means that your daily calorie intake should be reduced by 500-750 calories. Eating 1,200-1,500 calories a day can help most women lose weight. Eating 1,500-1,800 calories a day can help most men lose weight. What do I need to know about calorie counting? Work with your health care provider or dietitian to determine how many calories you should get each day. To meet your daily calorie goal, you will need to: Find out how many calories are in each food that you would like to eat. Try to do this  before you eat. Decide how much of the food you plan to eat. Keep a food log. Do this by writing down what you ate and how many calories it had. To successfully lose weight, it is important to balance calorie counting with a healthy lifestyle that includes regular activity. Where do I find calorie information? The number of calories in a food can be found on a Nutrition Facts label. If a food does not have a Nutrition Facts label, try to look up the calories online or ask your dietitian for help. Remember that calories are listed per serving. If you choose to have more than one serving of a food, you will have to multiply the calories per serving by the number of servings you plan to eat. For example, the label on a package of bread might say that a serving size is 1 slice and that there are 90 calories in a serving. If you eat 1 slice, you will have eaten 90 calories. If you eat 2 slices, you will have eaten 180 calories. How do I keep a food log? After each time that you eat, record the following in your food log as soon as possible: What you ate. Be sure to include toppings, sauces, and other extras on the food. How much you ate. This can be measured in cups, ounces, or number of items. How many calories were in each food and drink. The total number of calories in the food you ate. Keep your food log near you, such as in a pocket-sized notebook or on an app or website on your mobile phone. Some programs will calculate calories for you and show you how many calories you have left to meet your daily goal. What are some portion-control tips? Know how many calories are in a serving. This will help you know how many servings you can have of a certain food. Use a measuring cup to measure serving sizes. You could also try weighing out portions on a kitchen scale. With time, you will be able to estimate serving sizes for some foods. Take time to put servings of different foods on your favorite plates or  in your favorite bowls and cups so you know what a serving looks like. Try not to eat straight from a food's packaging, such as from a bag or box. Eating straight from the package makes it hard to see how much you are eating and can lead  to overeating. Put the amount you would like to eat in a cup or on a plate to make sure you are eating the right portion. Use smaller plates, glasses, and bowls for smaller portions and to prevent overeating. Try not to multitask. For example, avoid watching TV or using your computer while eating. If it is time to eat, sit down at a table and enjoy your food. This will help you recognize when you are full. It will also help you be more mindful of what and how much you are eating. What are tips for following this plan? Reading food labels Check the calorie count compared with the serving size. The serving size may be smaller than what you are used to eating. Check the source of the calories. Try to choose foods that are high in protein, fiber, and vitamins, and low in saturated fat, trans fat, and sodium. Shopping Read nutrition labels while you shop. This will help you make healthy decisions about which foods to buy. Pay attention to nutrition labels for low-fat or fat-free foods. These foods sometimes have the same number of calories or more calories than the full-fat versions. They also often have added sugar, starch, or salt to make up for flavor that was removed with the fat. Make a grocery list of lower-calorie foods and stick to it. Cooking Try to cook your favorite foods in a healthier way. For example, try baking instead of frying. Use low-fat dairy products. Meal planning Use more fruits and vegetables. One-half of your plate should be fruits and vegetables. Include lean proteins, such as chicken, [place], and fish. Lifestyle Each week, aim to do one of the following: 150 minutes of moderate exercise, such as walking. 75 minutes of vigorous exercise,  such as running. General information Know how many calories are in the foods you eat most often. This will help you calculate calorie counts faster. Find a way of tracking calories that works for you. Get creative. Try different apps or programs if writing down calories does not work for you. What foods should I eat?  Eat nutritious foods. It is better to have a nutritious, high-calorie food, such as an avocado, than a food with few nutrients, such as a bag of potato chips. Use your calories on foods and drinks that will fill you up and will not leave you hungry soon after eating. Examples of foods that fill you up are nuts and nut butters, vegetables, lean proteins, and high-fiber foods such as whole grains. High-fiber foods are foods with more than 5 g of fiber per serving. Pay attention to calories in drinks. Low-calorie drinks include water and unsweetened drinks. The items listed above may not be a complete list of foods and beverages you can eat. Contact a dietitian for more information. What foods should I limit? Limit foods or drinks that are not good sources of vitamins, minerals, or protein or that are high in unhealthy fats. These include: Candy. Other sweets. Sodas, specialty coffee drinks, alcohol, and juice. The items listed above may not be a complete list of foods and beverages you should avoid. Contact a dietitian for more information. How do I count calories when eating out? Pay attention to portions. Often, portions are much larger when eating out. Try these tips to keep portions smaller: Consider sharing a meal instead of getting your own. If you get your own meal, eat only half of it. Before you start eating, ask for a container and put half of your meal  into it. When available, consider ordering smaller portions from the menu instead of full portions. Pay attention to your food and drink choices. Knowing the way food is cooked and what is included with the meal can help  you eat fewer calories. If calories are listed on the menu, choose the lower-calorie options. Choose dishes that include vegetables, fruits, whole grains, low-fat dairy products, and lean proteins. Choose items that are boiled, broiled, grilled, or steamed. Avoid items that are buttered, battered, fried, or served with cream sauce. Items labeled as crispy are usually fried, unless stated otherwise. Choose water, low-fat milk, unsweetened iced tea, or other drinks without added sugar. If you want an alcoholic beverage, choose a lower-calorie option, such as a glass of wine or light beer. Ask for dressings, sauces, and syrups on the side. These are usually high in calories, so you should limit the amount you eat. If you want a salad, choose a garden salad and ask for grilled meats. Avoid extra toppings such as bacon, cheese, or fried items. Ask for the dressing on the side, or ask for olive oil and vinegar or lemon to use as dressing. Estimate how many servings of a food you are given. Knowing serving sizes will help you be aware of how much food you are eating at restaurants. Where to find more information Centers for Disease Control and Prevention: http://www.wolf.info/ [place] Department of Agriculture: http://www.wilson-mendoza.org/ Summary Calorie counting means keeping track of how many calories you eat and drink each day. If you eat fewer calories than your body needs, you should lose weight. A healthy amount of weight to lose per week is usually 1-2 lb (0.5-0.9 kg). This usually means reducing your daily calorie intake by 500-750 calories. The number of calories in a food can be found on a Nutrition Facts label. If a food does not have a Nutrition Facts label, try to look up the calories online or ask your dietitian for help. Use smaller plates, glasses, and bowls for smaller portions and to prevent overeating. Use your calories on foods and drinks that will fill you up and not leave you hungry shortly after a meal. This  information is not intended to replace advice given to you by your health care provider. Make sure you discuss any questions you have with your health care provider. Document Revised: 09/07/2019 Document Reviewed: 09/07/2019 Elsevier Patient Education  2022 [employer].

## 2021-05-14 NOTE — Progress Notes (Signed)
SLEEP MEDICINE CLINIC    Provider:  Larey Seat, MD  Primary Care Physician:  Antony Contras, MD [place] [address]     Referring Provider: Antony Contras, [place],  [place] [address]          Chief Complaint according to patient   Patient presents with:     New Patient (Initial Visit)           HISTORY OF PRESENT ILLNESS:   Ivan Allison is a 50 year -old White or Caucasian male patient seen here as a referral on 05/14/2021 from Dr Moreen Fowler , as he needs a new CPAP.   Chief concern according to patient : My machine displayed a message that its on the end of it's life span.  The patient had the first sleep study in January of the year 2016  with a result of an AHI ( Apnea Hypopnea index)  of OSA.   A recent compliance report is available visit closure date of 05-13-2021 patient used the machine 100% of the days and each of those over 4 hours with an average use at time of 9 hours and 22 minutes.  He is using an AutoSet with a serial #[id number] minimum pressure of 5 maximum pressure of 12 with a full-time expiratory pressure relief of 2 cmH2O his residual AHI is only 1.6/h which speaks for a really good resolution.  The pressure at the 95th percentile is 8.7 cmH2O and the patient's air leaks are 26 L/min at the 95th percentile.  No Cheyne-Stokes respirations were noted.   I have the pleasure of seeing Ivan Allison , a right-handed White or Caucasian male with OSA -sleep disorder , who  has a past medical history of Asthma, BMI 40.0-44.9, adult ([place]), Depression, Diabetes mellitus without complication ([place]), Displacement of lumbar intervertebral disc, GERD (gastroesophageal reflux disease), High cholesterol, Hypercholesterolemia, Hypertension, OSA (obstructive sleep apnea), Reflux esophagitis, and Renal stones (07/2016).  The patient has a history of several chronic low medical issues, he has a history of diabetes, he  is taking oral diabetes medication his last HbA1c was 7.3 in early September 2021 this is based on a note from October 10, 2020 by his [place] family Dr. Marijo File.  History of hypertension, hyperlipidemia, he is taking atorvastatin.  He has a history of GERD is taking Dexilant.  He has a history of major depression and he is on generic Lexapro generic Wellbutrin since 2016 feels that his symptoms have been well controlled.  History of kidney stones.  Followed by Dr. Bess Harvest.  History of allergic rhinitis followed by Dr. Donneta Romberg, he has Gotten weekly allergy shots earlier this year.  History of fatty liver nonalcoholic fatty liver Karlene Lineman LFTs have been mildly elevated in the past.  He has a history of moderate obstructive sleep apnea and has been on CPAP for which we have confirmed the diagnosis in January 2016 and also written for the new machine at that time with machine is now 72-1/51 years old and ready to retire.  He had a surgery in February of this year appendix removal by Dr. Carlis Abbott.  He had developed a post operative yeast infection which was treated with Diflucan.  His current weight is 276 pounds.  Body mass index is 41.  All labs reviewed well from March of this year or older.    His sleep study results: CPAP titration study :  Dated 08-15-14. The  patient had been diagnosed with obstructive sleep apnea apnea in the past and was occurrence CPAP user. Interestingly for someone was used to the CPAP he had a sleep latency of 145 minutes. His AHI was 37.2 RDI 38.9 no REM sleep was seen in the diagnostic study. CPAP was initiated at 5 cm water and step-by-step increased to 13. It appeared that 12 cm water was best tolerated but there was not a lot of time witnessed under that pressure setting. For this reason the patient was prescribed a CPAP of 10 by [employer]. 12 cm water pressure 3 cm EPR or flex function and an air-fluid P 10 mask. He likes to set the machine is quiet he likes it looks and its handedness he has a heated  holes which avoids conization water collection and the interface is very comfortable to him. The patient does have facial hair.  Family medical /sleep history:  No other family member on CPAP with OSA, insomnia, sleep walkers.    Social history:  Patient is working as Arboriculturist  and lives in a household with spouse,  one cat, no children. . Wife has OSA , too.  The patient currently works from home.  Tobacco use; none .  ETOH use ; seldomly,  Caffeine intake in form of Coffee( /) Soda( /) Tea ( 12 ounces a day in AM ) or energy drinks. Regular exercise in form of walking.   Hobbies :      Sleep habits are as follows:  The patient's dinner time is between 6-6.30 PM. The patient goes to bed at 10.30 PM and continues to sleep for 8-9 hours, wakes rarely for  bathroom breaks.    The preferred sleep position is sideways- some supine sleep, no prone sleep , with the support of 1 pillow. Flat bed. Dreams are reportedly rare.  8  AM is the usual rise time. The patient wakes up spontaneously.  He/reports feeling refreshed and restored in AM, without symptoms such as dry mouth, no morning headaches, and only rarely residual fatigue. Naps are taken infrequently, lasting from 30 to 60 minutes and are refreshing than nocturnal sleep.    Review of Systems: Out of a complete 14 system review, the patient complains of only the following symptoms, and all other reviewed systems are negative.:  Need a new CPAP.    How likely are you to doze in the following situations: 0 = not likely, 1 = slight chance, 2 = moderate chance, 3 = high chance   Sitting and Reading? Watching Television? Sitting inactive in a public place (theater or meeting)? As a passenger in a car for an hour without a break? Lying down in the afternoon when circumstances permit? Sitting and talking to someone? Sitting quietly after lunch without alcohol? In a car, while stopped for a few minutes in traffic?   Total =  1/ 24 points   FSS endorsed at 15/ 63 points.   Social History   Socioeconomic History   Marital status: Married    Spouse name: Ivan Allison   Number of children: Not on file   Years of education: Not on file   Highest education level: Not on file  Occupational History   Not on file  Tobacco Use   Smoking status: Never   Smokeless tobacco: Never  Substance and Sexual Activity   Alcohol use: Yes    Alcohol/week: 0.0 standard drinks    Comment: rarely   Drug use: No   Sexual activity:  Not on file  Other Topics Concern   Not on file  Social History Narrative   Right handed   Caffeine 1 cup avg daily.      Student/[employer], Working on [employer].  Married, no kids.     Social Determinants of Health   Financial Resource Strain: Not on file  Food Insecurity: Not on file  Transportation Needs: Not on file  Physical Activity: Not on file  Stress: Not on file  Social Connections: Not on file    History reviewed. No pertinent family history.  Past Medical History:  Diagnosis Date   Asthma    allergic asthmatic with medical treatment   BMI 40.0-44.9, adult ([place])    Depression    Diabetes mellitus without complication ([place])    Displacement of lumbar intervertebral disc    GERD (gastroesophageal reflux disease)    High cholesterol    Hypercholesterolemia    Hypertension    OSA (obstructive sleep apnea)    Reflux esophagitis    Renal stones 07/2016    Past Surgical History:  Procedure Laterality Date   APPENDECTOMY  2022   [place]  2021   removed     Current Outpatient Medications on File Prior to Visit  Medication Sig Dispense Refill   aspirin EC 81 MG tablet Take 81 mg by mouth daily.     atorvastatin (LIPITOR) 40 MG tablet Take 40 mg by mouth daily.     buPROPion (WELLBUTRIN XL) 150 MG 24 hr tablet Take 150 mg by mouth daily.     canagliflozin (INVOKANA) 100 MG TABS tablet Take 100 mg by mouth.      Cetirizine HCl 10 MG CAPS Take 10 mg by mouth daily.     Dexlansoprazole 30 MG capsule Take 1 capsule by mouth daily.     empagliflozin (JARDIANCE) 10 MG TABS tablet Take 10 mg by mouth daily.     escitalopram (LEXAPRO) 10 MG tablet Take 10 mg by mouth daily.     fluticasone (FLONASE) 50 MCG/ACT nasal spray Place 2 sprays into both nostrils daily.     lisinopril-hydrochlorothiazide (PRINZIDE,ZESTORETIC) 20-12.5 MG per tablet Take 2 tablets by mouth daily.     meloxicam (MOBIC) 15 MG tablet Take 15 mg by mouth daily as needed for pain.     metFORMIN (GLUCOPHAGE-XR) 500 MG 24 hr tablet Take 2 tablets by mouth every morning.     montelukast (SINGULAIR) 10 MG tablet Take 10 mg by mouth daily.     Multiple Vitamin (MULTIVITAMIN) tablet Take 1 tablet by mouth daily.     Potassium Citrate 15 MEQ (1620 MG) TBCR Take 15 mEq by mouth 2 (two) times daily.      Testosterone 20.25 MG/ACT (1.62%) GEL 5 g once daily.     No current facility-administered medications on file prior to visit.    Allergies  Allergen Reactions   Sulfamethoxazole-Trimethoprim Hives    Physical exam:  Today's Vitals   05/14/21 1254  BP: 129/87  Pulse: 84  SpO2: 97%  Weight: 279 lb 8 oz (126.8 kg)  Height: 5\' 9"  (1.753 m)   Body mass index is 41.27 kg/m.   Wt Readings from Last 3 Encounters:  05/14/21 279 lb 8 oz (126.8 kg)  10/22/15 288 lb (130.6 kg)  10/22/14 (!) 305 lb 9.6 oz (138.6 kg)     Ht Readings from Last 3 Encounters:  05/14/21 5\' 9"  (1.753 m)  10/22/15 5\' 9"  (  1.753 m)  10/22/14 5\' 9"  (1.753 m)      General: The patient is awake, alert and appears not in acute distress. The patient is well groomed. Head: Normocephalic, atraumatic. Neck is supple. Mallampati 3 plus- ,  neck circumference:20 inches . Nasal airflow  patent.  Retrognathia is  seen.  Dental status: intact  Cardiovascular:  Regular rate and cardiac rhythm by pulse,  without distended neck veins. Respiratory: Lungs are clear to  auscultation.  Skin:  Without evidence of ankle edema, or rash. Trunk: The patient's posture is erect.   Neurologic exam : The patient is awake and alert, oriented to place and time.   Memory subjective described as intact.  Attention span & concentration ability appears normal.  Speech is fluent,  without  dysarthria, dysphonia or aphasia.  Mood and affect are appropriate.   Cranial nerves: no loss of smell or taste reported  Pupils are equal and briskly reactive to light.  Funduscopic exam .  Extraocular movements in vertical and horizontal planes were intact and without nystagmus. No Diplopia. Visual fields by finger perimetry are intact. Hearing was intact to soft voice and finger rubbing.    Facial sensation intact to fine touch.  Facial motor strength is symmetric and tongue and uvula move midline.  Neck ROM : rotation, tilt and flexion extension were normal for age and shoulder shrug was symmetrical.    Motor exam:  restless, moving his feet and legs. Symmetric bulk, tone and ROM.   Normal tone without cog- wheeling, symmetric grip strength .right knee pain.    Sensory:  Fine touch, pinprick and vibration were  normal.  Proprioception tested in the upper extremities was normal.   Coordination: Rapid alternating movements in the fingers/hands were of normal speed.  The Finger-to-nose maneuver was intact without evidence of ataxia, dysmetria or tremor.   Gait and station: Patient could rise unassisted from a seated position, walked without assistive device.  Stance is of normal width/ base and the patient turned with 3 steps.  Toe and heel walk were deferred.  Deep tendon reflexes: in the upper and lower extremities are symmetric and intact.  Babinski response was deferred.       After spending a total time of  45  minutes face to face and additional time for physical and neurologic examination, review of laboratory studies,  personal review of imaging studies, reports and  results of other testing and review of referral information / records as far as provided in visit, I have established the following assessments:  1) CPAP dependent patient after 15 years of OSA, needs a new machine and for this we will order a new baseline.  He wishes for a new CPAP before his deductible resets.     My Plan is to proceed with:  1) HST, Mark as urgent to read.  2) CPAP auto : 5-12 cm water, 2 cm EPR.  3) patient needs a weight loss plan. Has not been on Ozempic or similar meds yet.   I would like to thank Antony Contras, MD and Antony Contras, [place],  [place] [address] for allowing me to meet with and to take care of this pleasant patient.   I plan to follow up either personally or through our NP within 2-3 month.   CC: I will share my notes with PCP. Marland Kitchen  Electronically signed by: Larey Seat, MD 05/14/2021 1:06 PM  [facility] Board certified  by The American Board of Sleep Medicine and Diplomate of the [employer] of Sleep Medicine. Board certified In Neurology through the [place], Fellow of the [employer] of Neurology. Medical Director of [employer] [employer].

## 2021-06-12 ENCOUNTER — Encounter: Payer: Self-pay | Admitting: Neurology

## 2021-06-16 ENCOUNTER — Ambulatory Visit (INDEPENDENT_AMBULATORY_CARE_PROVIDER_SITE_OTHER): Payer: 59 | Admitting: Cardiovascular Disease

## 2021-06-16 ENCOUNTER — Encounter: Payer: Self-pay | Admitting: Cardiovascular Disease

## 2021-06-16 ENCOUNTER — Other Ambulatory Visit: Payer: Self-pay

## 2021-06-16 VITALS — BP 116/70 | HR 99 | Ht 69.0 in | Wt 282.0 lb

## 2021-06-16 DIAGNOSIS — R0602 Shortness of breath: Secondary | ICD-10-CM | POA: Diagnosis not present

## 2021-06-16 NOTE — Patient Instructions (Signed)
Medication Instructions:  No changes *If you need a refill on your cardiac medications before your next appointment, please call your pharmacy*   Lab Work: If you have labs (blood work) drawn today and your tests are completely normal, you will receive your results only by: Anna (if you have MyChart) OR A paper copy in the mail If you have any lab test that is abnormal or we need to change your treatment, we will call you to review the results.   Testing/Procedures: Calcium Score CT  Your physician has requested that you have an echocardiogram. Echocardiography is a painless test that uses sound waves to create images of your heart. It provides your doctor with information about the size and shape of your heart and how well your heart's chambers and valves are working. This procedure takes approximately one hour. There are no restrictions for this procedure.    Follow-Up: At [facility], you and your health needs are our priority.  As part of our continuing mission to provide you with exceptional heart care, we have created designated Provider Care Teams.  These Care Teams include your primary Cardiologist (physician) and Advanced Practice Providers (APPs -  Physician Assistants and Nurse Practitioners) who all work together to provide you with the care you need, when you need it.  Your next appointment:   1 year(s)  The format for your next appointment:   In Person  Provider:   Lauree Chandler, MD    Other Instructions None

## 2021-06-16 NOTE — Progress Notes (Signed)
Chief Complaint  Patient presents with   New Patient (Initial Visit)    Dyspnea     History of Present Illness:50 yo male with history of asthma, DM, depression, hyperlipidemia, HTN, sleep apnea and GERD here today as a new consult, referred by Dr. Moreen Fowler, for evaluation of dyspnea. He tells me that he has had no prior heart issues. He has noticed dyspnea on exertion. He has no chest pain or pressure. No dizziness, near syncope or lower extremity edema.   Primary Care Physician: Antony Contras, MD   Past Medical History:  Diagnosis Date   Abdominal pain    Adenoma of cecum    Allergic rhinitis    Asthma    allergic asthmatic with medical treatment   BMI 40.0-44.9, adult ([place])    Calculus of gallbladder and bile duct w/o cholecystitis or obstruction    Depression    Diabetes mellitus without complication ([place])    Displacement of lumbar intervertebral disc    DM (diabetes mellitus) ([place])    Elevated LDL cholesterol level    Fatty infiltration of liver    GERD (gastroesophageal reflux disease)    High cholesterol    History of kidney stones    Hypercholesterolemia    Hypertension    Obesity    OSA (obstructive sleep apnea)    Reflux esophagitis    Renal stones 07/2016    Past Surgical History:  Procedure Laterality Date   APPENDECTOMY  2022   EXTRACORPOREAL SHOCK WAVE LITHOTRIPSY     GALLBLADDER SURGERY  2021   removed    Current Outpatient Medications  Medication Sig Dispense Refill   aspirin EC 81 MG tablet Take 81 mg by mouth daily.     atorvastatin (LIPITOR) 40 MG tablet Take 40 mg by mouth daily.     buPROPion (WELLBUTRIN XL) 150 MG 24 hr tablet Take 150 mg by mouth daily.     Cetirizine HCl 10 MG CAPS Take 10 mg by mouth daily.     Dexlansoprazole 30 MG capsule Take 1 capsule by mouth daily.     empagliflozin (JARDIANCE) 10 MG TABS tablet Take 10 mg by mouth daily.     escitalopram (LEXAPRO) 10 MG tablet Take 10 mg by mouth daily.     fluticasone (FLONASE)  50 MCG/ACT nasal spray Place 2 sprays into both nostrils daily.     lisinopril-hydrochlorothiazide (PRINZIDE,ZESTORETIC) 20-12.5 MG per tablet Take 2 tablets by mouth daily.     meloxicam (MOBIC) 15 MG tablet Take 15 mg by mouth daily as needed for pain.     metFORMIN (GLUCOPHAGE-XR) 500 MG 24 hr tablet Take 2 tablets by mouth every morning.     montelukast (SINGULAIR) 10 MG tablet Take 10 mg by mouth daily.     Multiple Vitamin (MULTIVITAMIN) tablet Take 1 tablet by mouth daily.     Potassium Citrate 15 MEQ (1620 MG) TBCR Take 15 mEq by mouth 2 (two) times daily.      Testosterone 20.25 MG/ACT (1.62%) GEL 5 g once daily.     No current facility-administered medications for this visit.    Allergies  Allergen Reactions   Sulfamethoxazole-Trimethoprim Hives and Other (See Comments)    Social History   Socioeconomic History   Marital status: Married    Spouse name: Manuela Schwartz   Number of children: Not on file   Years of education: Not on file   Highest education level: Not on file  Occupational History   Occupation: Medical Coding  Tobacco  Use   Smoking status: Never   Smokeless tobacco: Never  Substance and Sexual Activity   Alcohol use: Yes    Alcohol/week: 0.0 standard drinks    Comment: rarely   Drug use: No   Sexual activity: Not on file  Other Topics Concern   Not on file  Social History Narrative   Right handed   Caffeine 1 cup avg daily.      Student/[employer], Working on [employer].  Married, no kids.     Social Determinants of Health   Financial Resource Strain: Not on file  Food Insecurity: Not on file  Transportation Needs: Not on file  Physical Activity: Not on file  Stress: Not on file  Social Connections: Not on file  Intimate Partner Violence: Not on file    Family History  Problem Relation Age of Onset   Diabetes Mellitus I Mother    CAD Mother    Cancer - Other Father        Liver    Review of Systems:  As stated in the HPI and otherwise  negative.   BP 116/70   Pulse 99   Ht 5\' 9"  (1.753 m)   Wt 282 lb (127.9 kg)   SpO2 98%   BMI 41.64 kg/m   Physical Examination: General: Well developed, well nourished, NAD  HEENT: OP clear, mucus membranes moist  SKIN: warm, dry. No rashes. Neuro: No focal deficits  Musculoskeletal: Muscle strength 5/5 all ext  Psychiatric: Mood and affect normal  Neck: No JVD, no carotid bruits, no thyromegaly, no lymphadenopathy.  Lungs:Clear bilaterally, no wheezes, rhonci, crackles Cardiovascular: Regular rate and rhythm. No murmurs, gallops or rubs. Abdomen:Soft. Bowel sounds present. Non-tender.  Extremities: No lower extremity edema. Pulses are 2 + in the bilateral DP/PT.  EKG:  EKG is ordered today. The ekg ordered today demonstrates sinus  Recent Labs: No results found for requested labs within last 8760 hours.   Lipid Panel No results found for: CHOL, TRIG, HDL, CHOLHDL, VLDL, LDLCALC, LDLDIRECT   Wt Readings from Last 3 Encounters:  06/16/21 282 lb (127.9 kg)  05/14/21 279 lb 8 oz (126.8 kg)  10/22/15 288 lb (130.6 kg)    Assessment and Plan:   1. Dyspnea: Risk factors for CAD include DM, HTN, HLD, obesity and FH of CAD. Will arrange an echo to assess LVEF and exclude structural heart disease. Will arrange a CT coronary calcium score for cardiac risk assessment.   Current medicines are reviewed at length with the patient today.  The patient does not have concerns regarding medicines.  The following changes have been made:  no change  Labs/ tests ordered today include:   Orders Placed This Encounter  Procedures   CT CARDIAC SCORING   EKG 12-Lead   ECHOCARDIOGRAM COMPLETE      Disposition:   F/U with me one year.    Signed, Lauree Chandler, MD 06/16/2021 2:31 PM    [address], [place], [place]  [address] Phone: [phone]; Fax: [phone]

## 2021-06-23 ENCOUNTER — Ambulatory Visit (INDEPENDENT_AMBULATORY_CARE_PROVIDER_SITE_OTHER): Payer: 59 | Admitting: Neurology

## 2021-06-23 DIAGNOSIS — G4733 Obstructive sleep apnea (adult) (pediatric): Secondary | ICD-10-CM

## 2021-06-23 DIAGNOSIS — G4734 Idiopathic sleep related nonobstructive alveolar hypoventilation: Secondary | ICD-10-CM

## 2021-06-23 DIAGNOSIS — Z6841 Body Mass Index (BMI) 40.0 and over, adult: Secondary | ICD-10-CM

## 2021-06-23 DIAGNOSIS — Z9989 Dependence on other enabling machines and devices: Secondary | ICD-10-CM

## 2021-06-26 NOTE — Progress Notes (Signed)
[facility] TEST REPORT ( by Watch PAT)   STUDY DATE:  06-26-2021 DOB:  [birth year]   ORDERING CLINICIAN: Larey Seat, MD  REFERRING CLINICIAN: Antony Contras, MD    CLINICAL INFORMATION/HISTORY: referral on 05/14/2021 from Dr Moreen Fowler , as he needs a new CPAP.    Chief concern according to patient : My machine displayed a message that its on the end of it's life span.  The patient had the first sleep study in January of the year 2016..    A recent compliance report is available visit closure date of 05-13-2021 patient used the machine 100% of the days and each of those over 4 hours with an average use at time of 9 hours and 22 minutes.  He is using an AutoSet with a serial #[id number] minimum pressure of 5 maximum pressure of 12 with a full-time expiratory pressure relief of 2 cmH2O his residual AHI is only 1.6/h which speaks for a really good resolution.  The pressure at the 95th percentile is 8.7 cmH2O and the patient's air leaks are 26 L/min at the 95th percentile.  No Cheyne-Stokes respirations were noted. He wishes for a new CPAP before his deductible resets.       My Plan is to proceed with:   1) HST, Mark as urgent to read.  2) CPAP auto : 5-12 cm water, 2 cm EPR.      Epworth sleepiness score: 1/24.   BMI: 41.5 kg/m   Neck Circumference: 20"   FINDINGS:   Sleep Summary:   Total Recording Time (hours, min): Total recording time amounted to 9 hours 23 minutes of which 8 hours and 26 minutes were considered total sleep time the percentage of REM sleep was 11.1%.                           Respiratory Indices:   Calculated pAHI (per hour):   The overall apnea hypopnea index was severe.  An AHI of 52.4/h was noted in rem sleep 60.4/h in non-REM REM sleep 51.5/h.  Positional data and snoring data were not transmitted as the chest wall electrode failed.                                                                 Oxygen  Saturation Statistics:   Oxygen Saturation (%) Mean: Oxygen saturations varied between a nadir of 79% and a maximum saturation of 99% with a mean saturation at 90%.  Hypoxia with oxygen saturation less than 89% was present 432.2 minutes of total sleep time the equivalent of 26% of total sleep time.  This is severe hypoxia  Pulse rate varied between 60 and 119 bpm with a mean heart rate of 77 bpm.  Please note that a home sleep test cannot give cardiac rhythm data only the heart rate is reflected.               IMPRESSION:  This HST confirms the presence of severe obstructive sleep apnea not dependent on REM sleep but associated with significant hypoxemia and the lowest oxygen saturation was noted during REM sleep to occur.  Sleep was  moderately fragmented.  The patient will need to immediately resume CPAP therapy and an overnight pulse oximetry while on CPAP needs to be performed.  It is possible that CPAP alone will not correct this patient's oxygen deficiency.   RECOMMENDATION: Auto titration CPAP machine with a setting between 5 and 15 cmH2O, 3 cm EPR, mask of patient's choice and comfort, heated humidified tubing and machine, and an overnight pulse oximetry within the next 14 days.    This is an urgent order.    INTERPRETING PHYSICIAN:   Larey Seat, MD   Medical Director of [facility].

## 2021-07-02 ENCOUNTER — Other Ambulatory Visit: Payer: Self-pay

## 2021-07-02 ENCOUNTER — Ambulatory Visit (INDEPENDENT_AMBULATORY_CARE_PROVIDER_SITE_OTHER)
Admission: RE | Admit: 2021-07-02 | Discharge: 2021-07-02 | Disposition: A | Discharge status: Home / Self Care | Payer: Self-pay | Source: Ambulatory Visit | Attending: Cardiovascular Disease | Admitting: Cardiovascular Disease

## 2021-07-02 ENCOUNTER — Ambulatory Visit (HOSPITAL_COMMUNITY): Payer: 59 | Attending: Cardiovascular Disease

## 2021-07-02 DIAGNOSIS — R0602 Shortness of breath: Secondary | ICD-10-CM | POA: Diagnosis present

## 2021-07-02 LAB — ECHOCARDIOGRAM COMPLETE
Area-P 1/2: 4.15 cm2
S' Lateral: 2.4 cm

## 2021-07-08 NOTE — Progress Notes (Signed)
Chief Complaint  Patient presents with   Follow-up    Dyspnea     History of Present Illness:50 yo male with history of asthma, DM, depression, hyperlipidemia, HTN, sleep apnea and GERD here today for follow up. I saw him as a new consult 06/16/21  for evaluation of dyspnea. No prior cardiac issues. He reported dyspnea on exertion but no chest pain or chest pressure. Echo 07/02/21 with LVEF=40-45%. No valve disease. CT cardiac calcium score of zero.   He is here today for follow up. The patient denies any chest pain, palpitations, lower extremity edema, orthopnea, PND, dizziness, near syncope or syncope. He has ongoing dyspnea with exertion and fatigue.   Primary Care Physician: Antony Contras, MD  Past Medical History:  Diagnosis Date   Abdominal pain    Adenoma of cecum    Allergic rhinitis    Asthma    allergic asthmatic with medical treatment   BMI 40.0-44.9, adult ([place])    Calculus of gallbladder and bile duct w/o cholecystitis or obstruction    Depression    Diabetes mellitus without complication ([place])    Displacement of lumbar intervertebral disc    DM (diabetes mellitus) ([place])    Elevated LDL cholesterol level    Fatty infiltration of liver    GERD (gastroesophageal reflux disease)    High cholesterol    History of kidney stones    Hypercholesterolemia    Hypertension    Obesity    OSA (obstructive sleep apnea)    Reflux esophagitis    Renal stones 07/2016    Past Surgical History:  Procedure Laterality Date   APPENDECTOMY  2022   EXTRACORPOREAL SHOCK WAVE LITHOTRIPSY     GALLBLADDER SURGERY  2021   removed    Current Outpatient Medications  Medication Sig Dispense Refill   aspirin EC 81 MG tablet Take 81 mg by mouth daily.     atorvastatin (LIPITOR) 40 MG tablet Take 40 mg by mouth daily.     buPROPion (WELLBUTRIN XL) 150 MG 24 hr tablet Take 150 mg by mouth daily.     Cetirizine HCl 10 MG CAPS Take 10 mg by mouth daily.     Dexlansoprazole 30 MG  capsule Take 1 capsule by mouth daily.     empagliflozin (JARDIANCE) 10 MG TABS tablet Take 10 mg by mouth daily.     escitalopram (LEXAPRO) 10 MG tablet Take 10 mg by mouth daily.     fluticasone (FLONASE) 50 MCG/ACT nasal spray Place 2 sprays into both nostrils daily.     lisinopril-hydrochlorothiazide (PRINZIDE,ZESTORETIC) 20-12.5 MG per tablet Take 2 tablets by mouth daily.     meloxicam (MOBIC) 15 MG tablet Take 15 mg by mouth daily as needed for pain.     metFORMIN (GLUCOPHAGE-XR) 500 MG 24 hr tablet Take 2 tablets by mouth every morning.     metoprolol succinate (TOPROL XL) 25 MG 24 hr tablet Take 1 tablet (25 mg total) by mouth daily. 90 tablet 3   metoprolol tartrate (LOPRESSOR) 100 MG tablet Take 1 tablet (100 mg total) by mouth as directed. Take 2  hours before CT 1 tablet 0   montelukast (SINGULAIR) 10 MG tablet Take 10 mg by mouth daily.     Multiple Vitamin (MULTIVITAMIN) tablet Take 1 tablet by mouth daily.     Potassium Citrate 15 MEQ (1620 MG) TBCR Take 15 mEq by mouth 2 (two) times daily.      Testosterone 20.25 MG/ACT (1.62%) GEL 5 g once  daily.     OZEMPIC, 0.25 OR 0.5 MG/DOSE, 2 MG/1.5ML SOPN Inject 0.05 mg into the skin once a week.     No current facility-administered medications for this visit.    Allergies  Allergen Reactions   Sulfamethoxazole-Trimethoprim Hives and Other (See Comments)    Social History   Socioeconomic History   Marital status: Married    Spouse name: Manuela Schwartz   Number of children: Not on file   Years of education: Not on file   Highest education level: Not on file  Occupational History   Occupation: Medical Coding  Tobacco Use   Smoking status: Never   Smokeless tobacco: Never  Substance and Sexual Activity   Alcohol use: Yes    Alcohol/week: 0.0 standard drinks    Comment: rarely   Drug use: No   Sexual activity: Not on file  Other Topics Concern   Not on file  Social History Narrative   Right handed   Caffeine 1 cup avg daily.       Student/[employer], Working on [place].  Married, no kids.     Social Determinants of Health   Financial Resource Strain: Not on file  Food Insecurity: Not on file  Transportation Needs: Not on file  Physical Activity: Not on file  Stress: Not on file  Social Connections: Not on file  Intimate Partner Violence: Not on file    Family History  Problem Relation Age of Onset   Diabetes Mellitus I Mother    CAD Mother    Cancer - Other Father        Liver    Review of Systems:  As stated in the HPI and otherwise negative.   BP 116/70   Pulse 98   Ht 5\' 9"  (1.753 m)   Wt 277 lb 12.8 oz (126 kg)   SpO2 99%   BMI 41.02 kg/m   Physical Examination: General: Well developed, well nourished, NAD  HEENT: OP clear, mucus membranes moist  SKIN: warm, dry. No rashes. Neuro: No focal deficits  Musculoskeletal: Muscle strength 5/5 all ext  Psychiatric: Mood and affect normal  Neck: No JVD, no carotid bruits, no thyromegaly, no lymphadenopathy.  Lungs:Clear bilaterally, no wheezes, rhonci, crackles Cardiovascular: Regular rate and rhythm. No murmurs, gallops or rubs. Abdomen:Soft. Bowel sounds present. Non-tender.  Extremities: No lower extremity edema. Pulses are 2 + in the bilateral DP/PT.  EKG:  EKG is not ordered today. The ekg ordered today demonstrates   Echo 07/02/21:  1. Left ventricular ejection fraction, by estimation, is 40 to 45%. The  left ventricle has mildly decreased function. The left ventricle has no  regional wall motion abnormalities. Left ventricular diastolic parameters  were normal.   2. Right ventricular systolic function is normal. The right ventricular  size is normal.   3. The mitral valve is normal in structure. No evidence of mitral valve  regurgitation.   4. The aortic valve is normal in structure. Aortic valve regurgitation is  not visualized. No aortic stenosis is present.   Recent Labs: No results found for requested labs within last  8760 hours.   Lipid Panel No results found for: CHOL, TRIG, HDL, CHOLHDL, VLDL, LDLCALC, LDLDIRECT   Wt Readings from Last 3 Encounters:  07/09/21 277 lb 12.8 oz (126 kg)  06/16/21 282 lb (127.9 kg)  05/14/21 279 lb 8 oz (126.8 kg)    Assessment and Plan:   1. Dyspnea: Risk factors for CAD include DM, HTN, HLD, obesity and  FH of CAD. Calcium score zero. Echo with mild LV systolic dysfunction. No valve disease. Given LV systolic dysfunction and dyspnea, will arrange gated cardiac CT to exclude coronary artery disease.   2. Cardiomyopathy: Will continue Lisinopril and will start Toprol 25 mg daily. Cardiac CTA as above. BMET today.   Current medicines are reviewed at length with the patient today.  The patient does not have concerns regarding medicines.  The following changes have been made:  no change  Labs/ tests ordered today include:   Orders Placed This Encounter  Procedures   CT CORONARY MORPH W/CTA COR W/SCORE W/CA W/CM &/OR WO/CM   Basic metabolic panel    Disposition:   F/U with me in 6 months   Signed, Lauree Chandler, MD 07/09/2021 2:03 PM    [facility] [place], [place], [place]  [address] Phone: [phone]; Fax: [phone]

## 2021-07-09 ENCOUNTER — Ambulatory Visit (INDEPENDENT_AMBULATORY_CARE_PROVIDER_SITE_OTHER): Payer: 59 | Admitting: Cardiovascular Disease

## 2021-07-09 ENCOUNTER — Other Ambulatory Visit: Payer: Self-pay

## 2021-07-09 ENCOUNTER — Encounter: Payer: Self-pay | Admitting: Cardiovascular Disease

## 2021-07-09 VITALS — BP 116/70 | HR 98 | Ht 69.0 in | Wt 277.8 lb

## 2021-07-09 DIAGNOSIS — I428 Other cardiomyopathies: Secondary | ICD-10-CM

## 2021-07-09 DIAGNOSIS — R0602 Shortness of breath: Secondary | ICD-10-CM | POA: Diagnosis not present

## 2021-07-09 LAB — BASIC METABOLIC PANEL
BUN/Creatinine Ratio: 14 (ref 9–20)
BUN: 14 mg/dL (ref 6–24)
CO2: 27 mmol/L (ref 20–29)
Calcium: 9.6 mg/dL (ref 8.7–10.2)
Chloride: 98 mmol/L (ref 96–106)
Creatinine, Ser: 1.01 mg/dL (ref 0.76–1.27)
Glucose: 157 mg/dL — ABNORMAL HIGH (ref 70–99)
Potassium: 4.3 mmol/L (ref 3.5–5.2)
Sodium: 139 mmol/L (ref 134–144)
eGFR: 90 mL/min/{1.73_m2} (ref >59)

## 2021-07-09 MED ORDER — METOPROLOL TARTRATE 100 MG PO TABS: 100.0000 mg | ORAL_TABLET | ORAL | 0 refills | Status: DC

## 2021-07-09 MED ORDER — METOPROLOL SUCCINATE ER 25 MG PO TB24: 25.0000 mg | ORAL_TABLET | Freq: Every day | ORAL | 3 refills | Status: DC

## 2021-07-09 NOTE — Patient Instructions (Addendum)
Medication Instructions:  Start Metoprolol Succinate 25 mg daily   *If you need a refill on your cardiac medications before your next appointment, please call your pharmacy*   Lab Work: Bmp - Today   If you have labs (blood work) drawn today and your tests are completely normal, you will receive your results only by: [place] (if you have MyChart) OR A paper copy in the mail If you have any lab test that is abnormal or we need to change your treatment, we will call you to review the results.   Testing/Procedures: None ordered    Follow-Up: At [facility], you and your health needs are our priority.  As part of our continuing mission to provide you with exceptional heart care, we have created designated Provider Care Teams.  These Care Teams include your primary Cardiologist (physician) and Advanced Practice Providers (APPs -  Physician Assistants and Nurse Practitioners) who all work together to provide you with the care you need, when you need it.  We recommend signing up for the patient portal called "MyChart".  Sign up information is provided on this After Visit Summary.  MyChart is used to connect with patients for Virtual Visits (Telemedicine).  Patients are able to view lab/test results, encounter notes, upcoming appointments, etc.  Non-urgent messages can be sent to your provider as well.   To learn more about what you can do with MyChart, go to [email or web address].    Your next appointment:   12 month(s)  The format for your next appointment:   In Person  Provider:   Dr. Lauree Chandler    Other Instructions   Your cardiac CT will be scheduled at one of the below locations:   [facility] [address] [place], [place] [address] [phone]   If scheduled at [facility], please arrive at the [facility] main entrance (entrance A) of [facility] 30 minutes prior to test start time. You can use the FREE valet  parking offered at the main entrance (encouraged to control the heart rate for the test) Proceed to the [facility] Radiology Department (first floor) to check-in and test prep.   Please follow these instructions carefully (unless otherwise directed):  Hold all erectile dysfunction medications at least 3 days (72 hrs) prior to test.  On the Night Before the Test: Be sure to Drink plenty of water. Do not consume any caffeinated/decaffeinated beverages or chocolate 12 hours prior to your test. Do not take any antihistamines 12 hours prior to your test.  On the Day of the Test: Drink plenty of water until 1 hour prior to the test. Do not eat any food 4 hours prior to the test. You may take your regular medications prior to the test.  Take metoprolol (Lopressor) two hours prior to test. HOLD Lisinopril the morning of the test.       After the Test: Drink plenty of water. After receiving IV contrast, you may experience a mild flushed feeling. This is normal. On occasion, you may experience a mild rash up to 24 hours after the test. This is not dangerous. If this occurs, you can take Benadryl 25 mg and increase your fluid intake. If you experience trouble breathing, this can be serious. If it is severe call 911 IMMEDIATELY. If it is mild, please call our office. If you take any of these medications: Glipizide/Metformin, Avandament, Glucavance, please do not take 48 hours after completing test unless otherwise instructed.  Please allow  2-4 weeks for scheduling of routine cardiac CTs. Some insurance companies require a pre-authorization which may delay scheduling of this test.   For non-scheduling related questions, please contact the cardiac imaging nurse navigator should you have any questions/concerns: Marchia Bond, Cardiac Imaging Nurse Navigator Gordy Clement, Cardiac Imaging Nurse Navigator [facility] Direct Office Dial: [phone]   For scheduling  needs, including cancellations and rescheduling, please call [place], [phone].

## 2021-07-13 DIAGNOSIS — G4733 Obstructive sleep apnea (adult) (pediatric): Secondary | ICD-10-CM | POA: Insufficient documentation

## 2021-07-13 NOTE — Procedures (Signed)
[facility] [place] [facility] TEST REPORT ( by Watch PAT)   STUDY DATE:  06-26-2021 DOB:  [birth year]   ORDERING CLINICIAN: Larey Seat, MD  REFERRING CLINICIAN: Antony Contras, MD    CLINICAL INFORMATION/HISTORY: referral on 05/14/2021 from Dr Moreen Fowler , as he needs a new CPAP.    Chief concern according to patient : My machine displayed a message that its on the end of it's life span. The patient had the first sleep study in January of the year 2016. He is CPAP dependent.    A recent compliance report is available visit date of 05-13-2021: The  patient used the machine 100% of the days and each of those over 4 hours with an average user time of 9 hours and 22 minutes.  He is using an AutoSet with a serial #[id number] minimum pressure of 5 maximum pressure of 12 with a full-time expiratory pressure relief of 2 cmH2O his residual AHI is only 1.6/h which speaks for a really good resolution.  The pressure at the 95th percentile is 8.7 cmH2O and the patient's air leaks are 26 L/min at the 95th percentile.  No Cheyne-Stokes respirations were noted. He wishes for a new CPAP before his deductible resets.       My Plan is to proceed with:   1) HST, Mark as urgent to read.  2) CPAP auto : 5-12 cm water, 2 cm EPR.      Epworth sleepiness score: 1/24.   BMI: 41.5 kg/m   Neck Circumference: 20"   FINDINGS:   Sleep Summary:   Total Recording Time (hours, min): Total recording time amounted to 9 hours 23 minutes of which 8 hours and 26 minutes were considered total sleep time the percentage of REM sleep was 11.1%.                           Respiratory Indices:   Calculated pAHI (per hour):   The overall apnea hypopnea index was severe.  An AHI of 52.4/h was noted in rem sleep 60.4/h in non-REM REM sleep 51.5/h.  Positional data and snoring data were not transmitted as the chest wall electrode failed.                                                                 Oxygen Saturation  Statistics:   Oxygen Saturation (%) Mean: Oxygen saturations varied between a nadir of 79% and a maximum saturation of 99% with a mean saturation at 90%.  Hypoxia with oxygen saturation less than 89% was present 432.2 minutes of total sleep time the equivalent of 26% of total sleep time.  This is severe hypoxia  Pulse rate varied between 60 and 119 bpm with a mean heart rate of 77 bpm.  Please note that a home sleep test cannot give cardiac rhythm data only the heart rate is reflected.               IMPRESSION:  This HST confirms the presence of severe obstructive sleep apnea not dependent on REM sleep but associated with significant hypoxemia and the lowest oxygen saturation was noted during REM sleep to occur.  Sleep was moderately fragmented.  The patient will need to immediately resume  CPAP therapy and an overnight pulse oximetry while on CPAP needs to be performed.  It is possible that CPAP alone will not correct this patient's oxygen deficiency.   RECOMMENDATION: Auto titration CPAP machine with a setting between 5 and 15 cmH2O, 3 cm EPR, mask of patient's choice and comfort, heated humidified tubing and machine, and an overnight pulse oximetry within the next 14 days.    This is an urgent order.    INTERPRETING PHYSICIAN:   Larey Seat, MD   Medical Director of [facility] at [facility].      Sherryle Lis, [place] Nurse Tech     06/26/2021   Edit Copy                           [facility] [place] TEST REPORT ( by Watch PAT)   STUDY DATE:  06-26-2021 DOB:  [birth year]   ORDERING CLINICIAN: Larey Seat, MD  REFERRING CLINICIAN: Antony Contras, MD    CLINICAL INFORMATION/HISTORY: referral on 05/14/2021 from Dr Moreen Fowler , as he needs a new CPAP.    Chief concern according to patient : My machine displayed a message that its on the end of it's life span.  The patient had the first sleep study in January of the year 2016..    A recent compliance  report is available visit closure date of 05-13-2021 patient used the machine 100% of the days and each of those over 4 hours with an average use at time of 9 hours and 22 minutes.  He is using an AutoSet with a serial #[id number] minimum pressure of 5 maximum pressure of 12 with a full-time expiratory pressure relief of 2 cmH2O his residual AHI is only 1.6/h which speaks for a really good resolution.  The pressure at the 95th percentile is 8.7 cmH2O and the patient's air leaks are 26 L/min at the 95th percentile.  No Cheyne-Stokes respirations were noted. He wishes for a new CPAP before his deductible resets.       My Plan is to proceed with:   1) HST, Mark as urgent to read.  2) CPAP auto : 5-12 cm water, 2 cm EPR.        Epworth sleepiness score: 1/24.   BMI: 41.5 kg/m   Neck Circumference: 20"   FINDINGS:   Sleep Summary:   Total Recording Time (hours, min): Total recording time amounted to 9 hours 23 minutes of which 8 hours and 26 minutes were considered total sleep time the percentage of REM sleep was 11.1%.                           Respiratory Indices:   Calculated pAHI (per hour):   The overall apnea hypopnea index was severe.  An AHI of 52.4/h was noted in rem sleep 60.4/h in non-REM REM sleep 51.5/h.  Positional data and snoring data were not transmitted as the chest wall electrode failed.                                                                 Oxygen Saturation Statistics:   Oxygen Saturation (%) Mean: Oxygen saturations varied between a nadir of  79% and a maximum saturation of 99% with a mean saturation at 90%.  Hypoxia with oxygen saturation less than 89% was present 432.2 minutes of total sleep time the equivalent of 26% of total sleep time.  This is severe hypoxia  Pulse rate varied between 60 and 119 bpm with a mean heart rate of 77 bpm.  Please note that a home sleep test cannot give cardiac rhythm data only the heart rate is reflected.                IMPRESSION:  This HST confirms the presence of severe obstructive sleep apnea not dependent on REM sleep but associated with significant hypoxemia and the lowest oxygen saturation was noted during REM sleep to occur.  Sleep was moderately fragmented.  The patient will need to immediately resume CPAP therapy and an overnight pulse oximetry while on CPAP needs to be performed.  It is possible that CPAP alone will not correct this patient's oxygen deficiency.   RECOMMENDATION: Auto titration CPAP machine with a setting between 5 and 15 cmH2O, 3 cm EPR, mask of patient's choice and comfort, heated humidified tubing and machine, and an overnight pulse oximetry within the next 14 days.     This is an urgent order.     INTERPRETING PHYSICIAN:    Larey Seat, MD    Medical Director of [facility] at [facility].

## 2021-07-13 NOTE — Addendum Note (Signed)
Addended by: Larey Seat on: 07/13/2021 07:04 PM   Modules accepted: Orders

## 2021-07-13 NOTE — Progress Notes (Signed)
  Home sleep test Order: [id number] IMPRESSION:  This HST confirms the presence of severe obstructive sleep apnea not dependent on REM sleep but associated with significant hypoxemia and the lowest oxygen saturation was noted during REM sleep to occur.  Sleep was moderately fragmented.  The patient will need to immediately resume CPAP therapy and an overnight pulse oximetry while on CPAP needs to be performed.  It is possible that CPAP alone will not correct this patient's oxygen deficiency.  RECOMMENDATION: Auto titration CPAP machine with a setting between 5 and 15 cmH2O, 3 cm EPR, mask of patient's choice and comfort, heated humidified tubing and machine, and an overnight pulse oximetry within the next 14 days.    This is an urgent order.   INTERPRETING PHYSICIAN: Larey Seat, MD   Medical Director of [facility] at [facility].     Sherryle Lis, [place] Nurse Tech    06/26/2021

## 2021-07-14 ENCOUNTER — Telehealth: Payer: Self-pay | Admitting: Neurology

## 2021-07-14 ENCOUNTER — Encounter: Payer: Self-pay | Admitting: Cardiovascular Disease

## 2021-07-14 NOTE — Telephone Encounter (Signed)
I called pt. I advised pt that Dr. Brett Fairy reviewed their sleep study results and found that pt has severe sleep apnea. Dr. Brett Fairy recommends that pt continues with using auto cpap. I reviewed PAP compliance expectations with the pt. Pt is agreeable to starting a CPAP. I advised pt that an order will be sent to a DME, [facility], and [facility]  will call the pt within about one week after they file with the pt's insurance. [facility] will show the pt how to use the machine, fit for masks, and troubleshoot the CPAP if needed. A follow up appt was made for insurance purposes with Dr. Brett Fairy on Mar 2,2023 at 1:30 pm. Pt verbalized understanding to arrive 15 minutes early and bring their CPAP. A letter with all of this information in it will be mailed to the pt as a reminder. I verified with the pt that the address we have on file is correct. Pt verbalized understanding of results. Pt had no questions at this time but was encouraged to call back if questions arise. I have sent the order to [facility] and have received confirmation that they have received the order.

## 2021-07-14 NOTE — Telephone Encounter (Signed)
-----   Message from Larey Seat, MD sent at 07/13/2021  7:04 PM EST -----  Home sleep test Order: [id number] IMPRESSION:  This HST confirms the presence of severe obstructive sleep apnea not dependent on REM sleep but associated with significant hypoxemia and the lowest oxygen saturation was noted during REM sleep to occur.  Sleep was moderately fragmented.  The patient will need to immediately resume CPAP therapy and an overnight pulse oximetry while on CPAP needs to be performed.  It is possible that CPAP alone will not correct this patient's oxygen deficiency.  RECOMMENDATION: Auto titration CPAP machine with a setting between 5 and 15 cmH2O, 3 cm EPR, mask of patient's choice and comfort, heated humidified tubing and machine, and an overnight pulse oximetry within the next 14 days.    This is an urgent order.   INTERPRETING PHYSICIAN: Larey Seat, MD   Medical Director of [facility] at [facility].     Sherryle Lis, [place] Nurse Tech    06/26/2021

## 2021-07-15 MED ORDER — DIAZEPAM 5 MG PO TABS: ORAL_TABLET | ORAL | 0 refills | Status: DC

## 2021-07-18 ENCOUNTER — Telehealth (HOSPITAL_COMMUNITY): Payer: Self-pay | Admitting: *Deleted

## 2021-07-18 NOTE — Telephone Encounter (Signed)
Reaching out to patient to offer assistance regarding upcoming cardiac imaging study; pt verbalizes understanding of appt date/time, parking situation and where to check in, pre-test NPO status and medications ordered, and verified current allergies; name and call back number provided for further questions should they arise  Ivan Clement RN Navigator Cardiac Imaging [facility] [phone] office [phone] cell  Patient to take 100mg  metoprolol tartrate and 5mg  valium prior to cardiac CT scan.  He states he will have a ride and is aware to arrive at 9am for his 9:30am scan.

## 2021-07-22 ENCOUNTER — Encounter (HOSPITAL_COMMUNITY): Payer: Self-pay

## 2021-07-22 ENCOUNTER — Ambulatory Visit (HOSPITAL_COMMUNITY)
Admission: RE | Admit: 2021-07-22 | Discharge: 2021-07-22 | Disposition: A | Discharge status: Home / Self Care | Payer: 59 | Source: Ambulatory Visit | Attending: Cardiovascular Disease | Admitting: Cardiovascular Disease

## 2021-07-22 ENCOUNTER — Other Ambulatory Visit: Payer: Self-pay

## 2021-07-22 ENCOUNTER — Other Ambulatory Visit (HOSPITAL_COMMUNITY): Payer: Self-pay | Admitting: *Deleted

## 2021-07-22 DIAGNOSIS — R0602 Shortness of breath: Secondary | ICD-10-CM | POA: Diagnosis not present

## 2021-07-22 DIAGNOSIS — I428 Other cardiomyopathies: Secondary | ICD-10-CM | POA: Insufficient documentation

## 2021-07-22 DIAGNOSIS — R079 Chest pain, unspecified: Secondary | ICD-10-CM

## 2021-07-22 MED ORDER — IOHEXOL 350 MG/ML SOLN: 95.0000 mL | Freq: Once | INTRAVENOUS | Status: DC | PRN

## 2021-07-22 MED ORDER — DILTIAZEM HCL 25 MG/5ML IV SOLN
10.0000 mg | INTRAVENOUS | Status: DC | PRN
  Administered 2021-07-22: 10 mg via INTRAVENOUS

## 2021-07-22 MED ORDER — NITROGLYCERIN 0.4 MG SL SUBL
SUBLINGUAL_TABLET | SUBLINGUAL | Status: AC
  Filled 2021-07-22: qty 2

## 2021-07-22 MED ORDER — DILTIAZEM HCL 25 MG/5ML IV SOLN
INTRAVENOUS | Status: AC
  Administered 2021-07-22: 10 mg via INTRAVENOUS
  Filled 2021-07-22: qty 5

## 2021-07-22 MED ORDER — METOPROLOL TARTRATE 5 MG/5ML IV SOLN
10.0000 mg | INTRAVENOUS | Status: DC | PRN
  Administered 2021-07-22: 10 mg via INTRAVENOUS

## 2021-07-22 MED ORDER — NITROGLYCERIN 0.4 MG SL SUBL: 0.8000 mg | SUBLINGUAL_TABLET | Freq: Once | SUBLINGUAL | Status: DC

## 2021-07-22 MED ORDER — METOPROLOL TARTRATE 5 MG/5ML IV SOLN
INTRAVENOUS | Status: AC
  Administered 2021-07-22: 10 mg via INTRAVENOUS
  Filled 2021-07-22: qty 20

## 2021-07-22 MED ORDER — METOPROLOL TARTRATE 5 MG/5ML IV SOLN
INTRAVENOUS | Status: AC
  Administered 2021-07-22: 10 mg via INTRAVENOUS
  Filled 2021-07-22: qty 10

## 2021-07-22 NOTE — Progress Notes (Signed)
Pt premedicated with Metoprolol 100mg  PO and Valium 5mg  PO. Pt arrived with HR 90's. Metoprolol 10mg  IV X3 and Diltiazem 10mg  X2 given IV. HR 65-70 in nursing bay. HR 80-85 on scanner. Dr. Harriet Masson made aware and scan canceled. Pt verbalizes understanding. Gordy Clement, RN aware.

## 2021-07-25 ENCOUNTER — Encounter (HOSPITAL_COMMUNITY): Payer: Self-pay

## 2021-07-25 ENCOUNTER — Telehealth (HOSPITAL_COMMUNITY): Payer: Self-pay | Admitting: Emergency Medicine

## 2021-07-25 DIAGNOSIS — R079 Chest pain, unspecified: Secondary | ICD-10-CM

## 2021-07-25 MED ORDER — METOPROLOL TARTRATE 100 MG PO TABS: 100.0000 mg | ORAL_TABLET | ORAL | 0 refills | Status: DC

## 2021-07-25 MED ORDER — DIAZEPAM 5 MG PO TABS: ORAL_TABLET | ORAL | 0 refills | Status: AC

## 2021-07-25 MED ORDER — IVABRADINE HCL 5 MG PO TABS
10.0000 mg | ORAL_TABLET | Freq: Once | ORAL | 0 refills | Status: AC
Start: 2021-07-25 — End: 2021-07-25

## 2021-07-25 NOTE — Telephone Encounter (Signed)
Reaching out to patient to offer assistance regarding upcoming cardiac imaging study; pt verbalizes understanding of appt date/time, parking situation and where to check in, pre-test NPO status and medications ordered, and verified current allergies; name and call back number provided for further questions should they arise Marchia Bond RN Navigator Cardiac Imaging [place] [facility] [phone] office [phone] cell  For attempt CCTA #2 : we will have patient take 100mg  metoprolol tartrate + 10mg  ivabradine 2 hr prior to scan. Will also have patient take 5mg  valium 22mins prior to scan for anxiety. Pt verbalized understanding to have a designated driver for test. Clarise Cruz

## 2021-07-29 ENCOUNTER — Ambulatory Visit (HOSPITAL_COMMUNITY)
Admission: RE | Admit: 2021-07-29 | Discharge: 2021-07-29 | Disposition: A | Discharge status: Home / Self Care | Payer: 59 | Source: Ambulatory Visit | Attending: Cardiovascular Disease | Admitting: Cardiovascular Disease

## 2021-07-29 ENCOUNTER — Other Ambulatory Visit: Payer: Self-pay

## 2021-07-29 DIAGNOSIS — R079 Chest pain, unspecified: Secondary | ICD-10-CM

## 2021-07-29 MED ORDER — METOPROLOL TARTRATE 5 MG/5ML IV SOLN: 10.0000 mg | INTRAVENOUS | Status: DC | PRN

## 2021-07-29 MED ORDER — METOPROLOL TARTRATE 5 MG/5ML IV SOLN
INTRAVENOUS | Status: AC
  Administered 2021-07-29: 14:00:00 10 mg via INTRAVENOUS
  Filled 2021-07-29: qty 30

## 2021-07-29 MED ORDER — IOHEXOL 350 MG/ML SOLN
100.0000 mL | Freq: Once | INTRAVENOUS | Status: AC | PRN
  Administered 2021-07-29: 15:00:00 100 mL via INTRAVENOUS

## 2021-07-29 MED ORDER — DILTIAZEM HCL 25 MG/5ML IV SOLN
INTRAVENOUS | Status: AC
  Administered 2021-07-29: 14:00:00 10 mg via INTRAVENOUS
  Filled 2021-07-29: qty 5

## 2021-07-29 MED ORDER — NITROGLYCERIN 0.4 MG SL SUBL
SUBLINGUAL_TABLET | SUBLINGUAL | Status: AC
  Filled 2021-07-29: qty 2

## 2021-07-29 MED ORDER — NITROGLYCERIN 0.4 MG SL SUBL
0.8000 mg | SUBLINGUAL_TABLET | Freq: Once | SUBLINGUAL | Status: AC
  Administered 2021-07-29: 14:00:00 0.8 mg via SUBLINGUAL

## 2021-07-29 MED ORDER — DILTIAZEM HCL 25 MG/5ML IV SOLN
10.0000 mg | INTRAVENOUS | Status: DC | PRN
  Administered 2021-07-29: 14:00:00 10 mg via INTRAVENOUS

## 2021-08-05 ENCOUNTER — Telehealth: Payer: Self-pay | Admitting: Cardiovascular Disease

## 2021-08-05 ENCOUNTER — Encounter: Payer: Self-pay | Admitting: Cardiovascular Disease

## 2021-08-05 DIAGNOSIS — E785 Hyperlipidemia, unspecified: Secondary | ICD-10-CM

## 2021-08-05 DIAGNOSIS — I428 Other cardiomyopathies: Secondary | ICD-10-CM

## 2021-08-05 MED ORDER — ROSUVASTATIN CALCIUM 40 MG PO TABS: 40.0000 mg | ORAL_TABLET | Freq: Every day | ORAL | 3 refills | Status: DC

## 2021-08-05 NOTE — Addendum Note (Signed)
Addended by: Willeen Cass A on: 08/05/2021 04:40 PM   Modules accepted: Orders

## 2021-08-05 NOTE — Telephone Encounter (Signed)
Please see My Chart message today regarding change to Crestor, labs in 12 weeks and one year follow up. Thanks, chris

## 2021-08-05 NOTE — Telephone Encounter (Signed)
Burnell Blanks, MD to [place],    I think changing to Crestor would be a great choice. We can send in a 90 day supply of Crestor 40 mg daily. You will need a lipid profile and liver function tests in 12 weeks.    I would consider your cardiomyopathy to be non-ischemic. I would continue the Lisinopril and Metoprolol as you are taking it now.    We can plan a follow up visit in one year.    Gerald Stabs

## 2021-08-05 NOTE — Telephone Encounter (Signed)
DC'ed atorvastatin.  New prescription for Crestor 40 mg daily sent.  Labs ordered and scheduled.  Recall placed.

## 2021-08-07 ENCOUNTER — Telehealth: Payer: Self-pay | Admitting: Neurology

## 2021-08-07 DIAGNOSIS — G4733 Obstructive sleep apnea (adult) (pediatric): Secondary | ICD-10-CM

## 2021-08-07 NOTE — Telephone Encounter (Signed)
Received the overnight oximetry result for the pt. There was a recorded time of 6 hr and 23 min. While wearing his CPAP the patient had a total of 106 min throughout the test where the oxygen level would dip down below 88%.  There was a total of 21.1 min of time spent consecutively below 88%.  According to this data the patient would be able to get oxygen covered and bled into his CPAP at bedtime.  Dr Brett Fairy is out of the office and I will have our work in MD review this report and see what recommendations order he would offer for the pt.

## 2021-08-12 ENCOUNTER — Encounter: Payer: Self-pay | Admitting: Neurology

## 2021-08-12 NOTE — Telephone Encounter (Signed)
Dr Brett Fairy has reviewed the data and agrees patient should get set up on oxygen. She recommends starting the patient with 2 L bled into CPAP. We can repeat a ONO on CPAP w/ oxygen once he is set up and determine if need to increase at that time. Order will be placed and sent for the patient.

## 2021-08-12 NOTE — Addendum Note (Signed)
Addended by: Darleen Crocker on: 08/12/2021 08:53 AM   Modules accepted: Orders

## 2021-09-21 ENCOUNTER — Encounter: Payer: Self-pay | Admitting: Neurology

## 2021-09-25 NOTE — Progress Notes (Signed)
Oximetry overnight on CPAP showed prolonged total hypoxia of 106 minutes. This patient will qualify for 2 liters of oxygen added to CPAP.

## 2021-10-09 ENCOUNTER — Ambulatory Visit (INDEPENDENT_AMBULATORY_CARE_PROVIDER_SITE_OTHER): Payer: 59 | Admitting: Neurology

## 2021-10-09 ENCOUNTER — Encounter: Payer: Self-pay | Admitting: Neurology

## 2021-10-09 DIAGNOSIS — G4733 Obstructive sleep apnea (adult) (pediatric): Secondary | ICD-10-CM | POA: Diagnosis not present

## 2021-10-09 DIAGNOSIS — Z9989 Dependence on other enabling machines and devices: Secondary | ICD-10-CM

## 2021-10-09 DIAGNOSIS — Z9981 Dependence on supplemental oxygen: Secondary | ICD-10-CM

## 2021-10-09 DIAGNOSIS — G4734 Idiopathic sleep related nonobstructive alveolar hypoventilation: Secondary | ICD-10-CM

## 2021-10-09 DIAGNOSIS — I420 Dilated cardiomyopathy: Secondary | ICD-10-CM | POA: Diagnosis not present

## 2021-10-09 DIAGNOSIS — I509 Heart failure, unspecified: Secondary | ICD-10-CM | POA: Insufficient documentation

## 2021-10-09 NOTE — Progress Notes (Signed)
S[facility]EP [facility]INE [facility]    Provider:  Larey Seat, MD  Primary Care Physician:  Antony Contras, MD [address] [place] [address]     Referring Provider: Antony Contras, [place],  [place] [address]          Chief Complaint according to patient   Patient presents with:     New Patient (Initial Visit)           HISTORY OF PRESENT ILLNESS:  10-09-2021:  Ivan Allison is a 51 year -old  male patient seen here in his first RV after receiving a new CPAP machine-  10/09/2021 . CPAP dependent patient after 15 years of OSA. He was extremely fatigued, often Short of breath and deconditioned. He had no cardiac history at that time. In the mean time we worked him up for OSA, found severe apnea, and hypoxia. He was seen by Cardiologist Dr. Glennie Hawk   Mr. Hunke underwent a home sleep test which was on 11-17 2022.  His compliance reports from his CPAP at home are also available the patient was considered CPAP dependent.  He was 100% compliant user of an AutoSet CPAP with a residual AHI of only 1.6/h using a pressure range between 5 and 12 cmH2O with 2 cm expiratory pressure relief.  The patient's air leaks were moderate.  His home sleep test confirmed that severe obstructive apnea was still present it was not REM sleep dependent apnea but associated with significant hypoxemia the lowest oxygen saturation or nadir was at 79% and the patient presented with 26% of the total recorded sleep time in hypoxia.  So after he received a new auto titration CPAP device by [employer] with a setting between 5 and 15 cmH2O with 3 cm expiratory pressure relief mask of his choice heated humidification.  Also for the forwarded an order for an overnight pulse oximetry.  The compliance on his new CPAP is again excellent 100% for days and time with an average use at time of 9 hours and 47 minutes the residual AHI is 1.1/h no central apneas seem to arise, air leakage is  still moderate, 95th percentile pressure is 9.6 cm water.  So on the settings side, nothing had to change.    What did however return abnormal versus overnight pulse oximetry which counts continued to show hypoxia while the patient was on CPAP.  Results were posted 08-07-2021.   [employer] finally approved oxygen to be bled into the CPAP device at 2 L/min.  He would like a more portabel o2 concentrator,,and he will have to invest about 650 USD. Pulse or continuous use.    [name]   IMPRESSIONS     1. Left ventricular ejection fraction, by estimation, is 40 to 45%. The  left ventricle has mildly decreased function. The left ventricle has no  regional wall motion abnormalities. Left ventricular diastolic parameters  were normal.   2. Right ventricular systolic function is normal. The right ventricular  size is normal.   3. The mitral valve is normal in structure. No evidence of mitral valve  regurgitation.   4. The aortic valve is normal in structure. Aortic valve regurgitation is  not visualized. No aortic stenosis is present.   FINDINGS   Left Ventricle: Left ventricular ejection fraction, by estimation, is 40  to 45%. The left ventricle has mildly decreased function. The left  ventricle has no regional wall motion abnormalities. The left ventricular  internal cavity size was normal in  size. There is no left ventricular hypertrophy. Left ventricular diastolic  parameters were normal.   Right Ventricle: The right ventricular size is normal. Right vetricular  wall thickness was not well visualized. Right ventricular systolic  function is normal.   Left Atrium: Left atrial size was normal in size.   Right Atrium: Right atrial size was normal in size.   Pericardium: There is no evidence of pericardial effusion.   Mitral Valve: The mitral valve is normal in structure. No evidence of  mitral valve regurgitation.   Tricuspid Valve: The tricuspid valve is normal in  structure. Tricuspid  valve regurgitation is not demonstrated.   Aortic Valve: The aortic valve is normal in structure. Aortic valve  regurgitation is not visualized. No aortic stenosis is present.   Pulmonic Valve: The pulmonic valve was normal in structure. Pulmonic valve  regurgitation is not visualized.   Aorta: The aortic root and ascending aorta are structurally normal, with  no evidence of dilitation.    He feels that his fatigue and sleepiness have responded much better to the added oxygen then to CPAP alone.  He endorsed today the Epworth sleepiness score at one-point, fatigue severity at 25 points and his overall exercise tolerance has improved.  Originally seen upon referral form from Dr Moreen Fowler , as he needs a new CPAP.  Chief concern according to patient : "My machine displayed a message that its on the end of it's life span."  The patient had the first sleep study in January of the year 2016  with a result of an AHI ( Apnea Hypopnea index)  diagnostic of OSA.   A recent compliance report is available visit closure date of 05-13-2021 patient used the machine 100% of the days and each of those over 4 hours with an average use at time of 9 hours and 22 minutes.  He is using an AutoSet with a serial #[id number] minimum pressure of 5 maximum pressure of 12 with a full-time expiratory pressure relief of 2 cmH2O his residual AHI is only 1.6/h which speaks for a really good resolution.  The pressure at the 95th percentile is 8.7 cmH2O and the patient's air leaks are 26 L/min at the 95th percentile.  No Cheyne-Stokes respirations were noted.   I have the pleasure of seeing Ivan Allison , a right-handed White or Caucasian male with OSA -sleep disorder , who  has a past medical history of Abdominal pain, Adenoma of cecum, Allergic rhinitis, Asthma, BMI 40.0-44.9, adult ([place]), Calculus of gallbladder and bile duct w/o cholecystitis or obstruction, Depression, Diabetes mellitus without  complication ([place]), Displacement of lumbar intervertebral disc, DM (diabetes mellitus) ([place]), Elevated LDL cholesterol level, Fatty infiltration of liver, GERD (gastroesophageal reflux disease), High cholesterol, History of kidney stones, Hypercholesterolemia, Hypertension, Obesity, OSA (obstructive sleep apnea), Reflux esophagitis, and Renal stones (07/2016).      The patient has a history of several chronic low medical issues, he has a history of diabetes, he is taking oral diabetes medication his last HbA1c was 7.3 in early September 2021 this is based on a note from October 10, 2020 by his [place] family Dr. Antony Contras.  History of hypertension, hyperlipidemia, he is taking atorvastatin.  He has a history of GERD is taking Dexilant.  He has a history of major depression and he is on generic Lexapro generic Wellbutrin since 2016 feels that his symptoms have been well controlled.  History of kidney stones.  Followed by Dr.  Mani.  History of allergic rhinitis followed by Dr. Donneta Romberg, he has Gotten weekly allergy shots earlier this year.  History of fatty liver nonalcoholic fatty liver Karlene Lineman LFTs have been mildly elevated in the past.  He has a history of moderate obstructive sleep apnea and has been on CPAP for which we have confirmed the diagnosis in January 2016 and also written for the new machine at that time with machine is now 56-1/52 years old and ready to retire.  He had a surgery in February of this year appendix removal by Dr. Carlis Abbott.  He had developed a post operative yeast infection which was treated with Diflucan.  His current weight is 276 pounds.  Body mass index is 41.  All labs reviewed well from March of this year or older.   Interval ; 1. Dyspnea: Risk factors for CAD include DM, HTN, HLD, obesity and FH of CAD. Calcium score zero. Echo with mild LV systolic dysfunction. No valve disease. Given LV systolic dysfunction and dyspnea, will arrange gated cardiac CT to exclude coronary artery disease.     2. Cardiomyopathy: Will continue Lisinopril and will start Toprol 25 mg daily. Cardiac CTA as above. BMET today.   1. Dyspnea: Risk factors for CAD include DM, HTN, HLD, obesity and FH of CAD. Calcium score zero. Echo with mild LV systolic dysfunction. No valve disease. Given LV systolic dysfunction and dyspnea, will arrange gated cardiac CT to exclude coronary artery disease.    2. Cardiomyopathy: Will continue Lisinopril and will start Toprol 25 mg daily. Cardiac CTA as above. BMET today.    His sleep study results: were followed up by a HST on CPAP.  CPAP titration study :  Dated 08-15-14. The patient had been diagnosed with obstructive sleep apnea apnea in the past and was occurrence CPAP user. Interestingly for someone was used to the CPAP he had a sleep latency of 145 minutes. His AHI was 37.2 RDI 38.9 no REM sleep was seen in the diagnostic study. CPAP was initiated at 5 cm water and step-by-step increased to 13. It appeared that 12 cm water was best tolerated but there was not a lot of time witnessed under that pressure setting. For this reason the patient was prescribed a CPAP of 10 by [employer]. 12 cm water pressure 3 cm EPR or flex function and an air-fluid P 10 mask. He likes to set the machine is quiet he likes it looks and its handedness he has a heated holes which avoids conization water collection and the interface is very comfortable to him. The patient does have facial hair.  Family medical /sleep history:  No other family member on CPAP with OSA, insomnia, sleep walkers.    Social history:  Patient is working as Arboriculturist  and lives in a household with spouse,  one cat, no children. . Wife has OSA , too.  The patient currently works from home.  Tobacco use; none .  ETOH use ; seldomly,  Caffeine intake in form of Coffee( /) Soda( /) Tea ( 12 ounces a day in AM ) or energy drinks. Regular exercise in form of walking.   Hobbies :      Sleep habits are as  follows:  The patient's dinner time is between 6-6.30 PM. The patient goes to bed at 10.30 PM and continues to sleep for 8-9 hours, wakes rarely for  bathroom breaks.    The preferred sleep position is sideways- some supine sleep, no prone sleep ,  with the support of 1 pillow. Flat bed. Dreams are reportedly rare.  8  AM is the usual rise time. The patient wakes up spontaneously.  He/reports feeling refreshed and restored in AM, without symptoms such as dry mouth, no morning headaches, and only rarely residual fatigue. Naps are taken infrequently, lasting from 30 to 60 minutes and are refreshing than nocturnal sleep.    Review of Systems: Out of a complete 14 system review, the patient complains of only the following symptoms, and all other reviewed systems are negative.:  Need a new CPAP.    How likely are you to doze in the following situations: 0 = not likely, 1 = slight chance, 2 = moderate chance, 3 = high chance   Sitting and Reading? Watching Television? Sitting inactive in a public place (theater or meeting)? As a passenger in a car for an hour without a break? Lying down in the afternoon when circumstances permit? Sitting and talking to someone? Sitting quietly after lunch without alcohol? In a car, while stopped for a few minutes in traffic?   Total = 1/ 24 points   FSS endorsed at 25/ 63 points.   Social History   Socioeconomic History   Marital status: Married    Spouse name: Manuela Schwartz   Number of children: Not on file   Years of education: Not on file   Highest education level: Not on file  Occupational History   Occupation: Medical Coding  Tobacco Use   Smoking status: Never   Smokeless tobacco: Never  Substance and Sexual Activity   Alcohol use: Yes    Alcohol/week: 0.0 standard drinks    Comment: rarely   Drug use: No   Sexual activity: Not on file  Other Topics Concern   Not on file  Social History Narrative   Right handed   Caffeine 1 cup avg daily.       Student/[employer], Working on [place].  Married, no kids.     Social Determinants of Health   Financial Resource Strain: Not on file  Food Insecurity: Not on file  Transportation Needs: Not on file  Physical Activity: Not on file  Stress: Not on file  Social Connections: Not on file    Family History  Problem Relation Age of Onset   Diabetes Mellitus I Mother    CAD Mother    Cancer - Other Father        Liver    Past Medical History:  Diagnosis Date   Abdominal pain    Adenoma of cecum    Allergic rhinitis    Asthma    allergic asthmatic with medical treatment   BMI 40.0-44.9, adult ([place])    Calculus of gallbladder and bile duct w/o cholecystitis or obstruction    Depression    Diabetes mellitus without complication ([place])    Displacement of lumbar intervertebral disc    DM (diabetes mellitus) ([place])    Elevated LDL cholesterol level    Fatty infiltration of liver    GERD (gastroesophageal reflux disease)    High cholesterol    History of kidney stones    Hypercholesterolemia    Hypertension    Obesity    OSA (obstructive sleep apnea)    Reflux esophagitis    Renal stones 07/2016    Past Surgical History:  Procedure Laterality Date   APPENDECTOMY  2022   EXTRACORPOREAL SHOCK WAVE LITHOTRIPSY     GALLBLADDER SURGERY  2021   removed  Current Outpatient Medications on File Prior to Visit  Medication Sig Dispense Refill   aspirin EC 81 MG tablet Take 81 mg by mouth daily.     buPROPion (WELLBUTRIN XL) 150 MG 24 hr tablet Take 150 mg by mouth daily.     Cetirizine HCl 10 MG CAPS Take 10 mg by mouth daily.     Dexlansoprazole 30 MG capsule Take 1 capsule by mouth daily.     diazepam (VALIUM) 5 MG tablet Take one tablet by mouth 30 minutes prior to planned procedure. 1 tablet 0   empagliflozin (JARDIANCE) 10 MG TABS tablet Take 10 mg by mouth daily.     escitalopram (LEXAPRO) 10 MG tablet Take 10 mg by mouth daily.     fluticasone (FLONASE) 50 MCG/ACT  nasal spray Place 2 sprays into both nostrils daily.     lisinopril-hydrochlorothiazide (PRINZIDE,ZESTORETIC) 20-12.5 MG per tablet Take 2 tablets by mouth daily.     meloxicam (MOBIC) 15 MG tablet Take 15 mg by mouth daily as needed for pain.     metFORMIN (GLUCOPHAGE-XR) 500 MG 24 hr tablet Take 2 tablets by mouth 2 (two) times daily. Morning and night     metoprolol succinate (TOPROL XL) 25 MG 24 hr tablet Take 1 tablet (25 mg total) by mouth daily. 90 tablet 3   montelukast (SINGULAIR) 10 MG tablet Take 10 mg by mouth daily.     Multiple Vitamin (MULTIVITAMIN) tablet Take 1 tablet by mouth daily.     OZEMPIC, 0.25 OR 0.5 MG/DOSE, 2 MG/1.5ML SOPN Inject 0.05 mg into the skin once a week.     Potassium Citrate 15 MEQ (1620 MG) TBCR Take 15 mEq by mouth 2 (two) times daily.      rosuvastatin (CRESTOR) 40 MG tablet Take 1 tablet (40 mg total) by mouth daily. 90 tablet 3   Testosterone 20.25 MG/ACT (1.62%) GEL 5 g once daily.     No current facility-administered medications on file prior to visit.    Allergies  Allergen Reactions   Sulfamethoxazole-Trimethoprim Hives and Other (See Comments)    Physical exam:  Wt Readings from Last 3 Encounters:  07/09/21 277 lb 12.8 oz (126 kg)  06/16/21 282 lb (127.9 kg)  05/14/21 279 lb 8 oz (126.8 kg)     Ht Readings from Last 3 Encounters:  07/09/21 5\' 9"  (1.753 m)  06/16/21 5\' 9"  (1.753 m)  05/14/21 5\' 9"  (1.753 m)      General: The patient is awake, alert and appears not in acute distress. The patient is well groomed. Head: Normocephalic, atraumatic. Neck is supple. Mallampati 3 plus- ,  neck circumference:20 inches . Nasal airflow  patent.  Retrognathia is  seen.  Dental status: intact  Cardiovascular:  Regular rate and cardiac rhythm by pulse,  without distended neck veins. Respiratory: Lungs are clear to auscultation.  Skin:  Without evidence of ankle edema, or rash. Trunk: The patient's posture is erect.   Neurologic exam : The  patient is awake and alert, oriented to place and time.   Memory subjective described as intact.  Attention span & concentration ability appears normal.  Speech is fluent,  without  dysarthria, dysphonia or aphasia.  Mood and affect are appropriate.   Cranial nerves: no loss of smell or taste reported  Pupils are equal and briskly reactive to light.  Funduscopic exam .  Extraocular movements in vertical and horizontal planes were intact and without nystagmus. No Diplopia. Visual fields by finger perimetry are intact. Hearing  was intact to soft voice and finger rubbing.    Facial sensation intact to fine touch.  Facial motor strength is symmetric and tongue and uvula move midline.  Neck ROM : rotation, tilt and flexion extension were normal for age and shoulder shrug was symmetrical.    Motor exam:  restless, moving his feet and legs. Symmetric bulk, tone and ROM.   Normal tone without cog- wheeling, symmetric grip strength .right knee pain.    Gait and station/ Deep tendon reflexes: in the upper and lower extremities are symmetric and intact.  Babinski response was deferred.     After spending a total time of  35  minutes face to face and additional time for physical and neurologic examination, review of laboratory studies,  personal review of imaging studies, reports and results of other testing and review of referral information / records as far as provided in visit,   I have established the following assessments/ PLAN :  Hypoxia and sleep apnea, OSA responded to auto CPAP but hypoxia remained. Now on supplementally O2 and finally evaluated by Cardiology- he has cardiomyopathy. Echo showed 45% EF.  I will order an ONO on oxyten to make sure this is enough supplementation for him.  I also encouraged him to try and get a travel oxygen concentrator - but I am unsure as to insurance coverage.     I would like to thank PCP and dr. Angelena Form  for allowing me to meet with and to take care  of this pleasant patient. I plan to follow up either personally or through our NP within 9-12 months.    Electronically signed by: Larey Seat, MD 10/09/2021 2:01 PM  [facility] Board certified by The [employer] of Sleep Medicine and Diplomate of the [employer] of Sleep Medicine. Board certified In Neurology through the [employer], Fellow of the [employer] of Neurology. Medical Director of [facility].

## 2021-10-10 NOTE — Addendum Note (Signed)
Addended by: Larey Seat on: 10/10/2021 10:50 AM ? ? Modules accepted: Orders ? ?

## 2021-10-13 NOTE — Progress Notes (Signed)
New orders faxed to DME: [facility] ?Fax:[phone]  ? ?Fax confirmation received  ?

## 2021-11-04 ENCOUNTER — Other Ambulatory Visit: Payer: 59

## 2021-11-05 ENCOUNTER — Other Ambulatory Visit: Payer: Self-pay

## 2021-11-05 ENCOUNTER — Other Ambulatory Visit: Payer: 59 | Admitting: *Deleted

## 2021-11-05 DIAGNOSIS — E785 Hyperlipidemia, unspecified: Secondary | ICD-10-CM

## 2021-11-05 DIAGNOSIS — I428 Other cardiomyopathies: Secondary | ICD-10-CM

## 2021-11-06 LAB — HEPATIC FUNCTION PANEL
ALT: 19 IU/L (ref 0–44)
AST: 17 IU/L (ref 0–40)
Albumin: 4.8 g/dL (ref 3.8–4.9)
Alkaline Phosphatase: 73 IU/L (ref 44–121)
Bilirubin Total: 0.4 mg/dL (ref 0.0–1.2)
Bilirubin, Direct: 0.11 mg/dL (ref 0.00–0.40)
Total Protein: 7.5 g/dL (ref 6.0–8.5)

## 2021-11-06 LAB — LIPID PANEL
Chol/HDL Ratio: 3.5 ratio (ref 0.0–5.0)
Cholesterol, Total: 113 mg/dL (ref 100–199)
HDL: 32 mg/dL — ABNORMAL LOW (ref >39)
LDL Chol Calc (NIH): 54 mg/dL (ref 0–99)
Triglycerides: 156 mg/dL — ABNORMAL HIGH (ref 0–149)
VLDL Cholesterol Cal: 27 mg/dL (ref 5–40)

## 2022-05-11 ENCOUNTER — Other Ambulatory Visit: Payer: Self-pay

## 2022-05-11 MED ORDER — OZEMPIC (2 MG/DOSE) 8 MG/3ML ~~LOC~~ SOPN
PEN_INJECTOR | SUBCUTANEOUS | 0 refills | Status: DC
  Filled 2022-05-11 – 2022-05-15 (×3): qty 3, 28d supply, fill #0

## 2022-05-13 ENCOUNTER — Other Ambulatory Visit (HOSPITAL_COMMUNITY): Payer: Self-pay

## 2022-05-14 ENCOUNTER — Other Ambulatory Visit (HOSPITAL_COMMUNITY): Payer: Self-pay

## 2022-05-15 ENCOUNTER — Other Ambulatory Visit (HOSPITAL_COMMUNITY): Payer: Self-pay

## 2022-05-19 ENCOUNTER — Other Ambulatory Visit (HOSPITAL_COMMUNITY): Payer: Self-pay

## 2022-06-05 ENCOUNTER — Other Ambulatory Visit (HOSPITAL_COMMUNITY): Payer: Self-pay

## 2022-06-28 ENCOUNTER — Other Ambulatory Visit: Payer: Self-pay | Admitting: Cardiovascular Disease

## 2022-07-25 ENCOUNTER — Other Ambulatory Visit: Payer: Self-pay | Admitting: Cardiovascular Disease

## 2022-07-29 ENCOUNTER — Ambulatory Visit: Payer: 59 | Attending: Cardiovascular Disease | Admitting: Cardiovascular Disease

## 2022-07-29 ENCOUNTER — Encounter: Payer: Self-pay | Admitting: Cardiovascular Disease

## 2022-07-29 VITALS — BP 112/80 | HR 94 | Ht 70.0 in | Wt 259.2 lb

## 2022-07-29 DIAGNOSIS — I428 Other cardiomyopathies: Secondary | ICD-10-CM | POA: Diagnosis not present

## 2022-07-29 DIAGNOSIS — I2510 Atherosclerotic heart disease of native coronary artery without angina pectoris: Secondary | ICD-10-CM

## 2022-07-29 NOTE — Progress Notes (Signed)
Chief Complaint  Patient presents with   Follow-up    Non-ischemic cardiomyopathy    History of Present Illness:51 yo male with history of asthma, DM, depression, hyperlipidemia, HTN, sleep apnea, non-ischemic cardiomyopathy and GERD here today for follow up. I saw him as a new consult 06/16/21  for evaluation of dyspnea. No prior cardiac issues. He reported dyspnea on exertion but no chest pain or chest pressure. Echo 07/02/21 with LVEF=40-45%. No valve disease. CT cardiac calcium score of zero. Coronary CTA 07/29/21 with minimal plaque in the LAD.   He is here today for follow up. The patient denies any chest pain, dyspnea, palpitations, lower extremity edema, orthopnea, PND, dizziness, near syncope or syncope.   Primary Care Physician: Antony Contras, MD  Past Medical History:  Diagnosis Date   Abdominal pain    Adenoma of cecum    Allergic rhinitis    Asthma    allergic asthmatic with medical treatment   BMI 40.0-44.9, adult ([place])    Calculus of gallbladder and bile duct w/o cholecystitis or obstruction    Depression    Diabetes mellitus without complication ([place])    Displacement of lumbar intervertebral disc    DM (diabetes mellitus) ([place])    Elevated LDL cholesterol level    Fatty infiltration of liver    GERD (gastroesophageal reflux disease)    High cholesterol    History of kidney stones    Hypercholesterolemia    Hypertension    Obesity    OSA (obstructive sleep apnea)    Reflux esophagitis    Renal stones 07/2016    Past Surgical History:  Procedure Laterality Date   APPENDECTOMY  2022   EXTRACORPOREAL SHOCK WAVE LITHOTRIPSY     GALLBLADDER SURGERY  2021   removed    Current Outpatient Medications  Medication Sig Dispense Refill   aspirin EC 81 MG tablet Take 81 mg by mouth daily.     buPROPion (WELLBUTRIN XL) 150 MG 24 hr tablet Take 150 mg by mouth daily.     Cetirizine HCl 10 MG CAPS Take 10 mg by mouth daily.     Dexlansoprazole 30 MG capsule Take  1 capsule by mouth daily.     diazepam (VALIUM) 5 MG tablet Take one tablet by mouth 30 minutes prior to planned procedure. 1 tablet 0   empagliflozin (JARDIANCE) 10 MG TABS tablet Take 10 mg by mouth daily.     escitalopram (LEXAPRO) 10 MG tablet Take 10 mg by mouth daily.     fluticasone (FLONASE) 50 MCG/ACT nasal spray Place 2 sprays into both nostrils daily.     lisinopril-hydrochlorothiazide (PRINZIDE,ZESTORETIC) 20-12.5 MG per tablet Take 2 tablets by mouth daily.     meloxicam (MOBIC) 15 MG tablet Take 15 mg by mouth daily as needed for pain.     metFORMIN (GLUCOPHAGE-XR) 500 MG 24 hr tablet Take 2 tablets by mouth 2 (two) times daily. Morning and night     metoprolol succinate (TOPROL-XL) 25 MG 24 hr tablet TAKE 1 TABLET (25 MG TOTAL) BY MOUTH DAILY. 90 tablet 0   montelukast (SINGULAIR) 10 MG tablet Take 10 mg by mouth daily.     Multiple Vitamin (MULTIVITAMIN) tablet Take 1 tablet by mouth daily.     Potassium Citrate 15 MEQ (1620 MG) TBCR Take 15 mEq by mouth 2 (two) times daily.      rosuvastatin (CRESTOR) 40 MG tablet Take 1 tablet (40 mg total) by mouth daily. 90 tablet 3   Semaglutide,  2 MG/DOSE, (OZEMPIC, 2 MG/DOSE,) 8 MG/3ML SOPN Inject '2mg'$  into the skin once weekly. 3 mL 0   Testosterone 20.25 MG/ACT (1.62%) GEL 5 g once daily.     OZEMPIC, 0.25 OR 0.5 MG/DOSE, 2 MG/1.5ML SOPN Inject 0.05 mg into the skin once a week.     No current facility-administered medications for this visit.    Allergies  Allergen Reactions   Sulfamethoxazole-Trimethoprim Hives and Other (See Comments)    Social History   Socioeconomic History   Marital status: Married    Spouse name: Manuela Schwartz   Number of children: Not on file   Years of education: Not on file   Highest education level: Not on file  Occupational History   Occupation: Medical Coding  Tobacco Use   Smoking status: Never   Smokeless tobacco: Never  Substance and Sexual Activity   Alcohol use: Yes    Alcohol/week: 0.0  standard drinks of alcohol    Comment: rarely   Drug use: No   Sexual activity: Not on file  Other Topics Concern   Not on file  Social History Narrative   Right handed   Caffeine 1 cup avg daily.      Student/[employer], Working on [place].  Married, no kids.     Social Determinants of Health   Financial Resource Strain: Not on file  Food Insecurity: Not on file  Transportation Needs: Not on file  Physical Activity: Not on file  Stress: Not on file  Social Connections: Not on file  Intimate Partner Violence: Not on file    Family History  Problem Relation Age of Onset   Diabetes Mellitus I Mother    CAD Mother    Cancer - Other Father        Liver    Review of Systems:  As stated in the HPI and otherwise negative.   BP 112/80   Pulse 94   Ht '5\' 10"'$  (1.778 m)   Wt 259 lb 3.2 oz (117.6 kg)   SpO2 98%   BMI 37.19 kg/m   Physical Examination:  General: Well developed, well nourished, NAD  HEENT: OP clear, mucus membranes moist  SKIN: warm, dry. No rashes. Neuro: No focal deficits  Musculoskeletal: Muscle strength 5/5 all ext  Psychiatric: Mood and affect normal  Neck: No JVD, no carotid bruits, no thyromegaly, no lymphadenopathy.  Lungs:Clear bilaterally, no wheezes, rhonci, crackles Cardiovascular: Regular rate and rhythm. No murmurs, gallops or rubs. Abdomen:Soft. Bowel sounds present. Non-tender.  Extremities: No lower extremity edema. Pulses are 2 + in the bilateral DP/PT.  EKG:  EKG is ordered today. The ekg ordered today demonstrates NSR  Echo 07/02/21:  1. Left ventricular ejection fraction, by estimation, is 40 to 45%. The  left ventricle has mildly decreased function. The left ventricle has no  regional wall motion abnormalities. Left ventricular diastolic parameters  were normal.   2. Right ventricular systolic function is normal. The right ventricular  size is normal.   3. The mitral valve is normal in structure. No evidence of mitral valve   regurgitation.   4. The aortic valve is normal in structure. Aortic valve regurgitation is  not visualized. No aortic stenosis is present.   Recent Labs: 11/05/2021: ALT 19   Lipid Panel    Component Value Date/Time   CHOL 113 11/05/2021 0759   TRIG 156 (H) 11/05/2021 0759   HDL 32 (L) 11/05/2021 0759   CHOLHDL 3.5 11/05/2021 0759   LDLCALC 54 11/05/2021 0759  Wt Readings from Last 3 Encounters:  07/29/22 259 lb 3.2 oz (117.6 kg)  07/09/21 277 lb 12.8 oz (126 kg)  06/16/21 282 lb (127.9 kg)    Assessment and Plan:   1. CAD without angina: Coronary CTA in December 202 with minimal early LAD plaque. His dyspnea is not felt to be cardiac related. Will continue ASA  statin and beta blocker.   2. Non-ischemic Cardiomyopathy: Echo in 2022 with LVEF=40-45%. Will repeat echo now that he has been on medical therapy for one year. Continue Lisinopril and Toprol.   Labs/ tests ordered today include:   Orders Placed This Encounter  Procedures   EKG 12-Lead   ECHOCARDIOGRAM COMPLETE   Disposition:   F/U with me in 12 months   Signed, Lauree Chandler, MD 07/29/2022 12:04 PM    [address], [place], [place]  [address] Phone: [phone]; Fax: [phone]

## 2022-07-29 NOTE — Patient Instructions (Signed)
Medication Instructions:  Your physician recommends that you continue on your current medications as directed. Please refer to the Current Medication list given to you today.  *If you need a refill on your cardiac medications before your next appointment, please call your pharmacy*   Lab Work: None. If you have labs (blood work) drawn today and your tests are completely normal, you will receive your results only by: Ivan Allison (if you have MyChart) OR A paper copy in the mail If you have any lab test that is abnormal or we need to change your treatment, we will call you to review the results.   Testing/Procedures: Your physician has requested that you have an echocardiogram. Echocardiography is a painless test that uses sound waves to create images of your heart. It provides your doctor with information about the size and shape of your heart and how well your heart's chambers and valves are working. This procedure takes approximately one hour. There are no restrictions for this procedure. Please do NOT wear cologne, perfume, aftershave, or lotions (deodorant is allowed). Please arrive 15 minutes prior to your appointment time.    Follow-Up: At [facility], you and your health needs are our priority.  As part of our continuing mission to provide you with exceptional heart care, we have created designated Provider Care Teams.  These Care Teams include your primary Cardiologist (physician) and Advanced Practice Providers (APPs -  Physician Assistants and Nurse Practitioners) who all work together to provide you with the care you need, when you need it.    Your next appointment:   1 year(s)  The format for your next appointment:   In Person  Provider:   Dr. Hope Pigeon, MD    Important Information About Sugar

## 2022-07-30 ENCOUNTER — Ambulatory Visit: Payer: 59 | Attending: Cardiovascular Disease

## 2022-07-30 DIAGNOSIS — I428 Other cardiomyopathies: Secondary | ICD-10-CM

## 2022-07-30 LAB — ECHOCARDIOGRAM COMPLETE
AR max vel: 4.21 cm2
AV Area VTI: 3.91 cm2
AV Area mean vel: 3.74 cm2
AV Mean grad: 3 mmHg
AV Peak grad: 5 mmHg
Ao pk vel: 1.12 m/s
Area-P 1/2: 2.07 cm2
S' Lateral: 3.4 cm
Single Plane A4C EF: 47.2 %

## 2022-07-30 MED ORDER — PERFLUTREN LIPID MICROSPHERE
1.0000 mL | INTRAVENOUS | Status: AC | PRN
  Administered 2022-07-30: 2 mL via INTRAVENOUS

## 2022-09-24 ENCOUNTER — Other Ambulatory Visit: Payer: Self-pay | Admitting: Cardiovascular Disease

## 2022-09-24 ENCOUNTER — Ambulatory Visit: Payer: 59 | Admitting: Cardiovascular Disease

## 2022-10-12 ENCOUNTER — Encounter: Payer: Self-pay | Admitting: Neurology

## 2022-10-12 ENCOUNTER — Ambulatory Visit: Payer: 59 | Admitting: Neurology

## 2022-10-12 VITALS — BP 118/84 | HR 96 | Ht 69.0 in | Wt 250.0 lb

## 2022-10-12 DIAGNOSIS — Z9981 Dependence on supplemental oxygen: Secondary | ICD-10-CM | POA: Diagnosis not present

## 2022-10-12 DIAGNOSIS — G4733 Obstructive sleep apnea (adult) (pediatric): Secondary | ICD-10-CM

## 2022-10-12 DIAGNOSIS — G4734 Idiopathic sleep related nonobstructive alveolar hypoventilation: Secondary | ICD-10-CM | POA: Diagnosis not present

## 2022-10-12 DIAGNOSIS — Z6836 Body mass index (BMI) 36.0-36.9, adult: Secondary | ICD-10-CM

## 2022-10-12 DIAGNOSIS — I420 Dilated cardiomyopathy: Secondary | ICD-10-CM

## 2022-10-12 DIAGNOSIS — E089 Diabetes mellitus due to underlying condition without complications: Secondary | ICD-10-CM

## 2022-10-12 DIAGNOSIS — Z9989 Dependence on other enabling machines and devices: Secondary | ICD-10-CM

## 2022-10-12 NOTE — Patient Instructions (Signed)
KEEPING IT CLEAN: CPAP HYGIENE PROPER UPKEEP OF YOUR CPAP MACHINE CAN HELP ENSURE THE DEVICE FUNCTIONS PROPERLY CPAP CLEANING INSTRUCTIONS Along with proper CPAP cleaning it is recommended that you replace your mask, tubing and filters once very 3 months and more frequently if you are sick.   DAILY CLEANING Do not use moisturizing soaps, bleach, scented oils, chlorine, or alcohol-based solutions to clean your supplies. These solutions may cause irritation to your skin and lungs and may reduce the life of your products. Dawn [employer] or Comparable works best for daily cleaning.  **If you've been sick, it's smart to wash your mask, tubing, humidifier and filter daily until your cold, flu or virus symptoms are gone. That can help reduce the amount of time you spend under the weather.  Before using your mask -wash your face daily with soap and water to remove excess facial oils. Wipe down your mask (including areas that come in contact with your skin) using a damp towel with soap and warm water. This will remove any oils, dead skin cells, and sweat on the mask that can affect the quality of the seal. Gently rinse with a clean towel and let the mask air-dry out of direct sunlight. You can also use unscented baby wipes or pre-moistened towels designed specifically for cleaning CPAP masks, which are available on-line. DO NOT USE CLOROX OR DISINFECTING WIPES. If your unit has a humidifier, empty any leftover water instead of letting in sit in the unit all day. Refill the humidifier with clean, distilled water right before bedtime for optimal use WEEKLY (OR MORE FREQUENT) CLEANING Your mask and tubing need a full bath at least once a week to keep it free of dust, bacteria, and germs. (During COVID-19 or any other flu/virus we recommend more frequent cleaning) Clean the CPAP tubing, nasal mask, and headgear in a bathroom sink filled with warm water and a few drops of ammonia-free, mild dish detergent. Avoid  using stronger cleaning products, as they may damage the mask or leave harmful residue. Swirl all parts around for about five minutes, rinse well and let air dry during the day. Hang the tubing over the shower rod, on a towel rack or in the laundry room to ensure all the water drips out. The mask and headgear can be air-dried on a towel or hung on a hook or hanger. You should also wipe down your CPAP machine with a damp cloth. Ensure the unit is unplugged. The towel shouldn't be too damp or wet, as water could get into the machine. Clean the filter by removing it and rinsing it in warm tap water. Run it under the water and squeeze to make sure there is no dust. Then blot down the filter with a towel. Do not wash your machine's white filter, if one is present--those are disposable and should be replaced every two weeks. If you are recovering from being sick, we recommend changing the filter sooner. If your CPAP has a humidifier, that also needs to be cleaned weekly. Empty any remaining water and then wash the water chamber in the sink with warm soapy water. Rinse well and drain out as much of the water as possible. Let the chamber air-dry before placing it back into the CPAP unit. Every other week you should disinfect the humidifier. Do that by soaking it in a solution of one-part vinegar to five parts water for 30 minutes, thoroughly rinsing and then placing in your dishwasher's top rack for washing. And keep  it clean by using only distilled water to prevent mineral deposits that can build up and cause damage to your machine. IMPORTANT TIPS Make caring for your CPAP equipment part of your morning routine. Keep machine and accessories out of direct sunlight to avoid damaging them. Never use bleach to clean accessories. Place machine on a level surface and away from curtains that may interfere with the air intake.

## 2022-10-12 NOTE — Progress Notes (Signed)
SLEEP MEDICINE CLINIC  Provider:  Larey Seat, MD  Primary Care Physician:  Antony Contras, MD [address] [place] [address]     Referring Provider: Antony Contras, [place],  [place] [address]          Chief Complaint according to patient   Patient presents with:     New Patient (Initial Visit)     Here for yearly follow up. Overall states things are well. DME [facility]. Pt also uses 2L bled into CPAP at night which has feels has been helpful       HISTORY OF PRESENT ILLNESS:  Ivan Allison is a 52 y.o. male CPAP user and OSA patient who is also oxygen dependent  here for revisit 10/12/2022 for  follow up on recent weight loss ( 35 pounds in 12 months on Ozempic)  and on supplemented nocturnal oxygen.  EF went from 40-45% to 55%. No longer SOB, less knee pain- a lot more mobility.   I am very happy with the current compliance download the compliance for the month of January was 100% and for the months of February 20 8 out of 30 days equivalent to 93% all of these days over 4 hours with an average use at time of 9 hours 28 minutes.  The patient uses a new AutoSet between a setting of 6 cm water pressure minimum and 14 cm water pressure maximum, and 2 cm expiratory pressure relief.  His residual AHI is 0.8/h which is an excellent resolution.  And the 95th percentile pressure was 10.3 cm water.  Air leaks are moderate 95th percentile is 22 L/min.  This is more commonly seen in patients with nasal pillows or nasal cradle masks and facial hair.           10/09/2021 . CPAP dependent patient after 15 years of OSA. He was extremely fatigued, often Short of breath and deconditioned. He had no cardiac history at that time. In the mean time we worked him up for OSA, found severe apnea, and hypoxia. He was seen by Cardiologist Dr. Glennie Hawk     Mr. Rion underwent a home sleep test which was on 11-17 2022.  His compliance reports  from his CPAP at home are also available the patient was considered CPAP dependent.  He was 100% compliant user of an AutoSet CPAP with a residual AHI of only 1.6/h using a pressure range between 5 and 12 cmH2O with 2 cm expiratory pressure relief.  The patient's air leaks were moderate.  His home sleep test confirmed that severe obstructive apnea was still present it was not REM sleep dependent apnea but associated with significant hypoxemia the lowest oxygen saturation or nadir was at 79% and the patient presented with 26% of the total recorded sleep time in hypoxia.  So after he received a new auto titration CPAP device by [employer] with a setting between 5 and 15 cmH2O with 3 cm expiratory pressure relief mask of his choice heated humidification.  Also for the forwarded an order for an overnight pulse oximetry.  The compliance on his new CPAP is again excellent 100% for days and time with an average use at time of 9 hours and 47 minutes the residual AHI is 1.1/h no central apneas seem to arise, air leakage is still moderate, 95th percentile pressure is 9.6 cm water.  So on the settings side, nothing had to change.  What did however return abnormal versus overnight pulse oximetry which counts continued to show hypoxia while the patient was on CPAP.  Results were posted 08-07-2021.    [employer] finally approved oxygen to be bled into the CPAP device at 2 L/min.  He would like a more portabel o2 concentrator,,and he will have to invest about 650 USD. Pulse or continuous use.        Review of Systems: Out of a complete 14 system review, the patient complains of only the following symptoms, and all other reviewed systems are negative.:  , snoring, fragmented sleep, Insomnia,  Nocturia 1 time-  down from 3-4 times.  More vivid dreams.   How likely are you to doze in the following situations: 0 = not likely, 1 = slight chance, 2 = moderate chance, 3 = high chance   Sitting and Reading? Watching  Television? Sitting inactive in a public place (theater or meeting)? As a passenger in a car for an hour without a break? Lying down in the afternoon when circumstances permit? Sitting and talking to someone? Sitting quietly after lunch without alcohol? In a car, while stopped for a few minutes in traffic?   10-12-2022 The patient's current Epworth sleepiness scale was endorsed at 1 out of 24 points he did not Total = 1/ 24 points   FSS endorsed at 13/ 63 points.   Social History   Socioeconomic History   Marital status: Married    Spouse name: Manuela Schwartz   Number of children: Not on file   Years of education: Not on file   Highest education level: Not on file  Occupational History   Occupation: Medical Coding  Tobacco Use   Smoking status: Never   Smokeless tobacco: Never  Substance and Sexual Activity   Alcohol use: Yes    Alcohol/week: 0.0 standard drinks of alcohol    Comment: rarely   Drug use: No   Sexual activity: Not on file  Other Topics Concern   Not on file  Social History Narrative   Right handed   Caffeine 1 cup avg daily.      Student/[employer], Working on [employer].  Married, no kids.     Social Determinants of Health   Financial Resource Strain: Not on file  Food Insecurity: Not on file  Transportation Needs: Not on file  Physical Activity: Not on file  Stress: Not on file  Social Connections: Not on file    Family History  Problem Relation Age of Onset   Diabetes Mellitus I Mother    CAD Mother    Cancer - Other Father        Liver    Past Medical History:  Diagnosis Date   Abdominal pain    Adenoma of cecum    Allergic rhinitis    Asthma    allergic asthmatic with medical treatment   BMI 40.0-44.9, adult ([place])    Calculus of gallbladder and bile duct w/o cholecystitis or obstruction    Depression    Diabetes mellitus without complication ([place])    Displacement of lumbar intervertebral disc    DM (diabetes mellitus) ([place])    Elevated  LDL cholesterol level    Fatty infiltration of liver    GERD (gastroesophageal reflux disease)    High cholesterol    History of kidney stones    Hypercholesterolemia    Hypertension    Obesity    OSA (obstructive sleep apnea)    Reflux esophagitis    Renal  stones 07/2016    Past Surgical History:  Procedure Laterality Date   APPENDECTOMY  2022   EXTRACORPOREAL SHOCK WAVE LITHOTRIPSY     GALLBLADDER SURGERY  2021   removed     Current Outpatient Medications on File Prior to Visit  Medication Sig Dispense Refill   aspirin EC 81 MG tablet Take 81 mg by mouth daily.     buPROPion (WELLBUTRIN XL) 150 MG 24 hr tablet Take 150 mg by mouth daily.     Cetirizine HCl 10 MG CAPS Take 10 mg by mouth daily.     Dexlansoprazole 30 MG capsule Take 1 capsule by mouth every other day.     diazepam (VALIUM) 5 MG tablet Take one tablet by mouth 30 minutes prior to planned procedure. 1 tablet 0   empagliflozin (JARDIANCE) 10 MG TABS tablet Take 10 mg by mouth daily.     escitalopram (LEXAPRO) 10 MG tablet Take 10 mg by mouth daily.     fluticasone (FLONASE) 50 MCG/ACT nasal spray Place 2 sprays into both nostrils daily.     lisinopril-hydrochlorothiazide (PRINZIDE,ZESTORETIC) 20-12.5 MG per tablet Take 2 tablets by mouth daily.     meloxicam (MOBIC) 15 MG tablet Take 15 mg by mouth daily as needed for pain.     metFORMIN (GLUCOPHAGE-XR) 500 MG 24 hr tablet Take 2 tablets by mouth 2 (two) times daily. Morning and night     metoprolol succinate (TOPROL-XL) 25 MG 24 hr tablet TAKE 1 TABLET (25 MG TOTAL) BY MOUTH DAILY. 90 tablet 2   montelukast (SINGULAIR) 10 MG tablet Take 10 mg by mouth daily.     Multiple Vitamin (MULTIVITAMIN) tablet Take 1 tablet by mouth daily.     Potassium Citrate 15 MEQ (1620 MG) TBCR Take 15 mEq by mouth daily.     rosuvastatin (CRESTOR) 40 MG tablet Take 1 tablet (40 mg total) by mouth daily. 90 tablet 3   Semaglutide, 2 MG/DOSE, (OZEMPIC, 2 MG/DOSE,) 8 MG/3ML SOPN Inject  '2mg'$  into the skin once weekly. 3 mL 0   Testosterone 20.25 MG/ACT (1.62%) GEL 5 g once daily.     No current facility-administered medications on file prior to visit.    Allergies  Allergen Reactions   Sulfamethoxazole-Trimethoprim Hives and Other (See Comments)     DIAGNOSTIC DATA (LABS, IMAGING, TESTING) - I reviewed patient records, labs, notes, testing and imaging myself where available.  Lab Results  Component Value Date   WBC 6.9 09/04/2010   HGB 14.5 09/04/2010   HCT 41.1 09/04/2010   MCV 84.7 09/04/2010   PLT 195 09/04/2010      Component Value Date/Time   NA 139 07/09/2021 1102   K 4.3 07/09/2021 1102   CL 98 07/09/2021 1102   CO2 27 07/09/2021 1102   GLUCOSE 157 (H) 07/09/2021 1102   GLUCOSE 147 (H) 09/04/2010 1055   BUN 14 07/09/2021 1102   CREATININE 1.01 07/09/2021 1102   CALCIUM 9.6 07/09/2021 1102   PROT 7.5 11/05/2021 0759   ALBUMIN 4.8 11/05/2021 0759   AST 17 11/05/2021 0759   ALT 19 11/05/2021 0759   ALKPHOS 73 11/05/2021 0759   BILITOT 0.4 11/05/2021 0759   GFRNONAA >60 09/04/2010 1055   GFRAA  09/04/2010 1055    >60        The eGFR has been calculated using the MDRD equation. This calculation has not been validated in all clinical situations. eGFR's persistently <60 mL/min signify possible Chronic Kidney Disease.   Lab Results  Component Value Date   CHOL 113 11/05/2021   HDL 32 (L) 11/05/2021   LDLCALC 54 11/05/2021   TRIG 156 (H) 11/05/2021   CHOLHDL 3.5 11/05/2021   No results found for: "HGBA1C" No results found for: "VITAMINB12" No results found for: "TSH"  PHYSICAL EXAM:  Today's Vitals   10/12/22 1314  BP: 118/84  Pulse: 96  Weight: 250 lb (113.4 kg)  Height: '5\' 9"'$  (1.753 m)   Body mass index is 36.92 kg/m.   Wt Readings from Last 3 Encounters:  10/12/22 250 lb (113.4 kg)  07/29/22 259 lb 3.2 oz (117.6 kg)  07/09/21 277 lb 12.8 oz (126 kg)     Ht Readings from Last 3 Encounters:  10/12/22 '5\' 9"'$  (1.753 m)   07/29/22 '5\' 10"'$  (1.778 m)  07/09/21 '5\' 9"'$  (1.753 m)      General: The patient is awake, alert and appears not in acute distress. The patient is well groomed. Head: Normocephalic, atraumatic. Neck is supple. Mallampati 2 -3,  neck circumference:18.5 inches .  Nasal airflow patent.  Retrognathia is not seen.  Dental status: biological  Cardiovascular:  Regular rate and cardiac rhythm by pulse,  without distended neck veins. Respiratory: Lungs are clear to auscultation.  Skin:  Without evidence of ankle edema, or rash. Trunk: The patient's posture is relaxed - BMI now 37.    NEUROLOGIC EXAM: The patient is awake and alert, oriented to place and time.   Neurologic exam : Memory subjective described as intact.  Attention span & concentration ability appears normal.  Speech is fluent,  without  dysarthria, dysphonia or aphasia.  Mood and affect are appropriate.   Cranial nerves: no loss of smell or taste reported  Pupils are equal and briskly reactive to light.  Funduscopic exam .  Extraocular movements in vertical and horizontal planes were intact and without nystagmus. No Diplopia. Visual fields by finger perimetry are intact. Hearing was intact to soft voice and finger rubbing.    Facial sensation intact to fine touch.  Facial motor strength is symmetric and tongue and uvula move midline.  Neck ROM : rotation, tilt and flexion extension were normal for age and shoulder shrug was symmetrical.    Motor exam:  restless, moving his feet and legs. Symmetric bulk, tone and ROM.   Normal tone without cog- wheeling, symmetric grip strength, right knee pain.    Sensory:  Fine touch, pinprick and vibration were  normal.  Proprioception tested in the upper extremities was normal.   Coordination: Rapid alternating movements in the fingers/hands were of normal speed.  The Finger-to-nose maneuver was intact without evidence of ataxia, dysmetria or tremor.   Gait and station: Patient could  rise unassisted from a seated position, walked without assistive device.  Stance is of normal width/ base and the patient turned with 3 steps.  Toe and heel walk were deferred.  Deep tendon reflexes: in the upper and lower extremities are symmetric and intact.  Babinski response was deferred.    ASSESSMENT AND PLAN 52 y.o. year old male with OSA on CPAP, hypoxia on O2, DM, morbid obesity now improved under ozempic  Here with: new CPAP issued 07-2021.     1) OSA is very well controlled on his new CPAP machine and he uses 02 at night while on CPAP-   2) risk factor of weight has been normalized, he lost 1.5 inches neck size .   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   I plan to follow up either personally or through our NP within 12 months. No changes in settings and no additional medications or therapies at this points.   I would like to thank Antony Contras, MD and Antony Contras, [place],  [place] [address] for allowing me to meet with and to take care of this pleasant patient.   CC: I will share my notes with PCP. Marland Kitchen  After spending a total time of  25  minutes face to face and additional time for physical and neurologic examination, review of laboratory studies,  personal review of imaging studies, reports and results of other testing and review of referral information / records as far as provided in visit,   Electronically signed by: Larey Seat, MD 10/12/2022 1:53 PM  [facility] [place] certified by The [employer] of Sleep Medicine and Diplomate of the [employer] of Sleep Medicine. Board certified In Neurology through the [place], Fellow of the [employer] of Neurology. Medical Director of [employer].

## 2022-11-10 IMAGING — CT CT ABD-PELV W/ CM
2 of 5 series · 15 of 46 positions shown, 17 images · IV contrast (iopamidol)
Comparison: CT from [REDACTED] on 07/18/2016

CLINICAL DATA: Colonic polyp.

EXAM:
CT ABDOMEN AND PELVIS WITH CONTRAST
TECHNIQUE: Multidetector CT imaging of the abdomen and pelvis was performed
using the standard protocol following bolus administration of
intravenous contrast.
CONTRAST:  100mL FR7XS8-HHH IOPAMIDOL (FR7XS8-HHH) INJECTION 61%

[Series 2: abd pelvis 5.00 br40 s3 axial · axial · 0.85mm/px · z∈[+1178,+1678]mm · 12 of 114 slices shown, 14 images]
[im 7/114  soft-tissue]
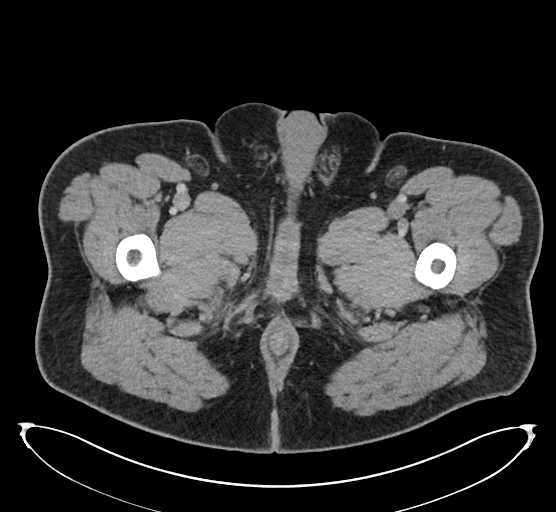
[im 7/114  bone]
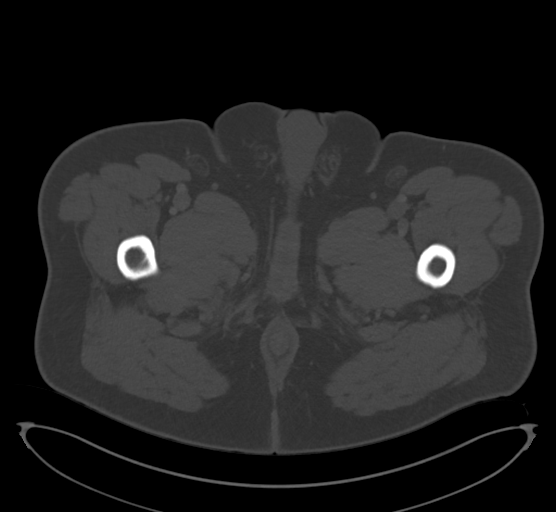
[im 19/114  soft-tissue]
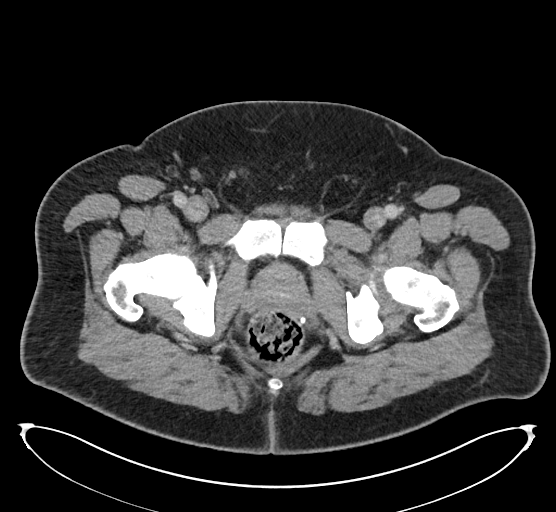
[im 26/114  soft-tissue]
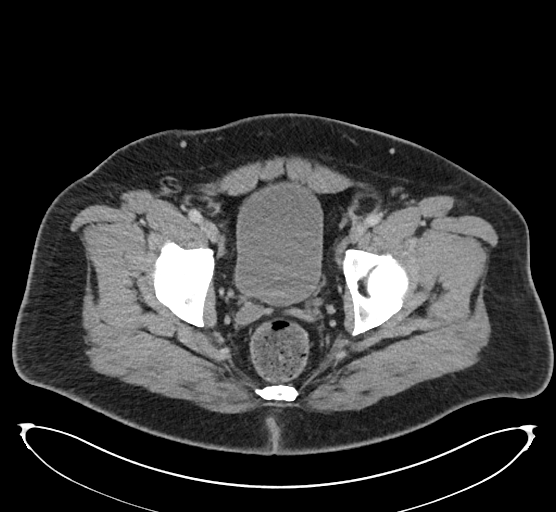
[im 32/114  soft-tissue]
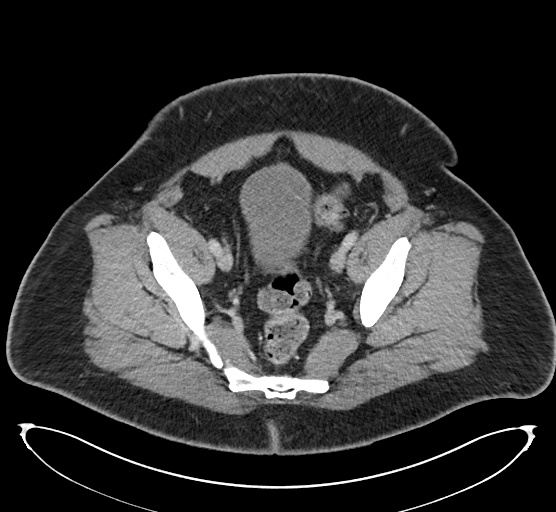
[im 44/114  soft-tissue]
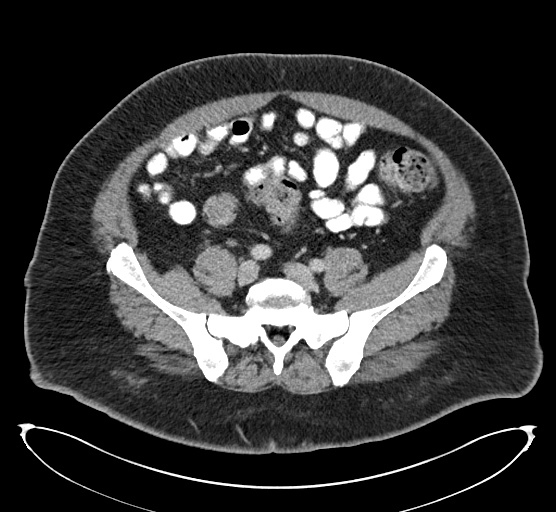
[im 51/114  soft-tissue]
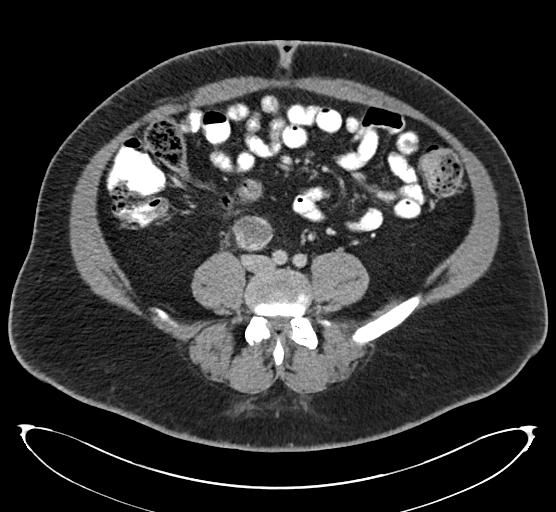
[im 63/114  soft-tissue]
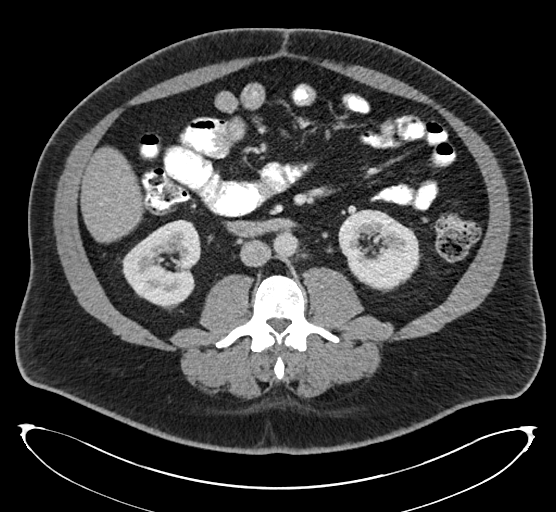
[im 70/114  soft-tissue]
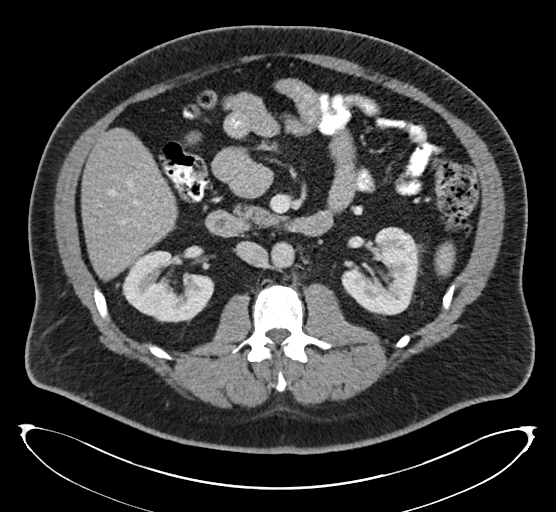
[im 82/114  soft-tissue]
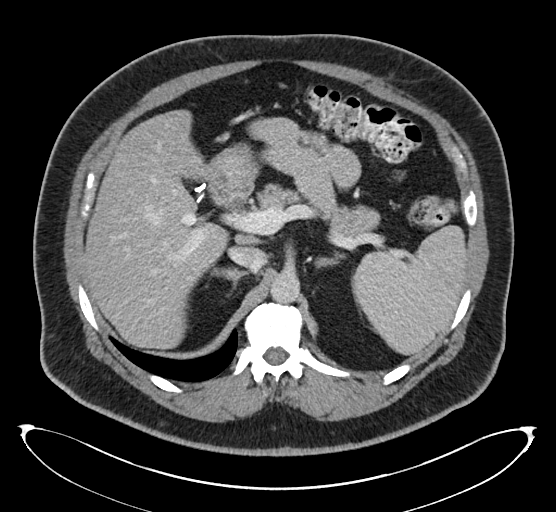
[im 82/114  bone]
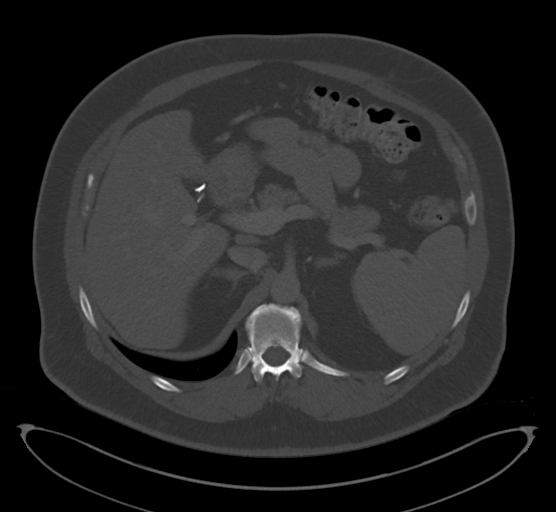
[im 88/114  soft-tissue]
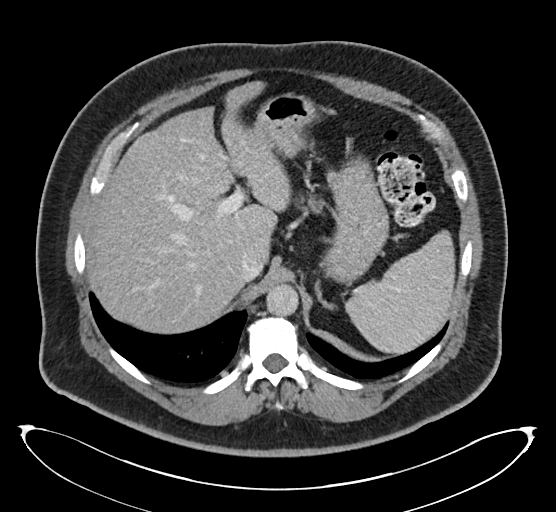
[im 95/114  soft-tissue]
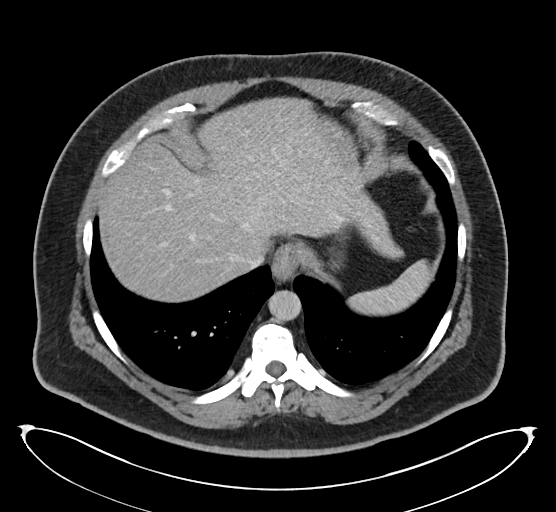
[im 107/114  soft-tissue]
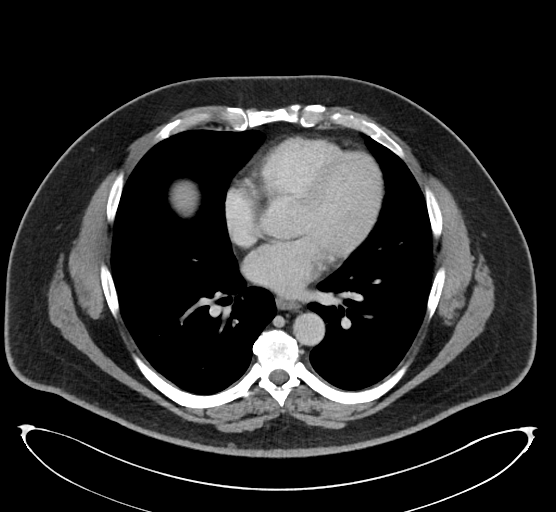

[Series 6: abd pelvis 2.00 br40 s3 cor · coronal · 0.92mm/px · 3 of 217 slices shown]
[im 73/217  soft-tissue]
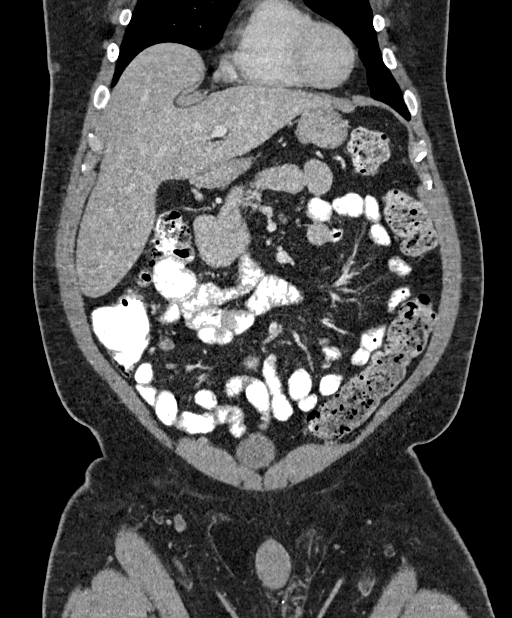
[im 97/217  soft-tissue]
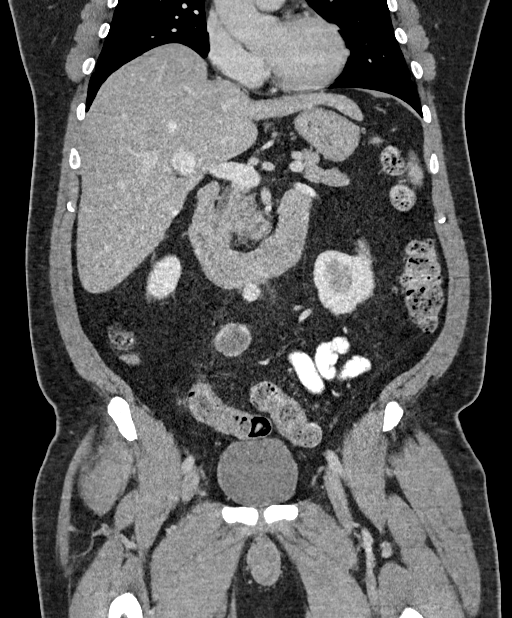
[im 121/217  soft-tissue]
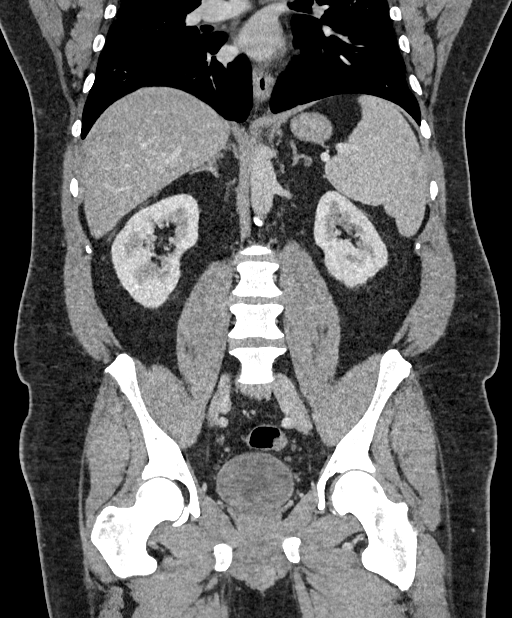

[15 of 46 positions shown; findings below may reference images not displayed]

FINDINGS: Lower Chest: No acute findings.

Hepatobiliary: No hepatic masses identified. Mild diffuse hepatic
steatosis again noted. Prior cholecystectomy. No evidence of biliary
obstruction.

Pancreas:  No mass or inflammatory changes.

Spleen: Within normal limits in size and appearance.

Adrenals/Urinary Tract: No masses identified. No evidence of
ureteral calculi or hydronephrosis.

Stomach/Bowel: No evidence of obstruction, inflammatory process or
abnormal fluid collections. No masses identified.

Vascular/Lymphatic: An aortocaval lymph node is seen which measures
1.5 cm short axis compared to 0.9 cm in 9015. Another
low-attenuation retroperitoneal mass or lymph node is seen along the
anterior aspect of the IVC, which measures 3.3 x 2.9 cm on image
63/2. This has increased from 1.4 cm previously. No other sites of
lymphadenopathy identified. No abdominal aortic aneurysm.

Reproductive:  No mass or other significant abnormality.

Other:  None.

Musculoskeletal:  No suspicious bone lesions identified.
IMPRESSION: Mild abdominal retroperitoneal lymphadenopathy, increased since 1356
exam, and suspicious for lymphoproliferative disorder or metastatic
disease. Consider PET-CT and/or tissue sampling.

Mild hepatic steatosis.

## 2023-05-12 ENCOUNTER — Encounter (HOSPITAL_COMMUNITY): Payer: Self-pay

## 2023-05-12 ENCOUNTER — Emergency Department (HOSPITAL_COMMUNITY)
Admission: EM | Admit: 2023-05-12 | Discharge: 2023-05-12 | Disposition: A | Discharge status: Home / Self Care | Payer: 59 | Attending: Emergency Medicine | Admitting: Emergency Medicine

## 2023-05-12 ENCOUNTER — Other Ambulatory Visit: Payer: Self-pay

## 2023-05-12 ENCOUNTER — Emergency Department (HOSPITAL_COMMUNITY): Payer: 59

## 2023-05-12 DIAGNOSIS — K219 Gastro-esophageal reflux disease without esophagitis: Secondary | ICD-10-CM | POA: Insufficient documentation

## 2023-05-12 DIAGNOSIS — Z7984 Long term (current) use of oral hypoglycemic drugs: Secondary | ICD-10-CM | POA: Insufficient documentation

## 2023-05-12 DIAGNOSIS — D72829 Elevated white blood cell count, unspecified: Secondary | ICD-10-CM | POA: Diagnosis not present

## 2023-05-12 DIAGNOSIS — Z7982 Long term (current) use of aspirin: Secondary | ICD-10-CM | POA: Diagnosis not present

## 2023-05-12 DIAGNOSIS — I10 Essential (primary) hypertension: Secondary | ICD-10-CM | POA: Diagnosis not present

## 2023-05-12 DIAGNOSIS — R112 Nausea with vomiting, unspecified: Secondary | ICD-10-CM | POA: Insufficient documentation

## 2023-05-12 DIAGNOSIS — E119 Type 2 diabetes mellitus without complications: Secondary | ICD-10-CM | POA: Diagnosis not present

## 2023-05-12 DIAGNOSIS — Z79899 Other long term (current) drug therapy: Secondary | ICD-10-CM | POA: Insufficient documentation

## 2023-05-12 LAB — CBC WITH DIFFERENTIAL/PLATELET
Abs Immature Granulocytes: 0.06 10*3/uL (ref 0.00–0.07)
Basophils Absolute: 0.1 10*3/uL (ref 0.0–0.1)
Basophils Relative: 1 %
Eosinophils Absolute: 0.1 10*3/uL (ref 0.0–0.5)
Eosinophils Relative: 1 %
HCT: 50 % (ref 39.0–52.0)
Hemoglobin: 16 g/dL (ref 13.0–17.0)
Immature Granulocytes: 0 %
Lymphocytes Relative: 6 %
Lymphs Abs: 0.9 10*3/uL (ref 0.7–4.0)
MCH: 27.1 pg (ref 26.0–34.0)
MCHC: 32 g/dL (ref 30.0–36.0)
MCV: 84.6 fL (ref 80.0–100.0)
Monocytes Absolute: 0.7 10*3/uL (ref 0.1–1.0)
Monocytes Relative: 5 %
Neutro Abs: 12.9 10*3/uL — ABNORMAL HIGH (ref 1.7–7.7)
Neutrophils Relative %: 87 %
Platelets: 246 10*3/uL (ref 150–400)
RBC: 5.91 MIL/uL — ABNORMAL HIGH (ref 4.22–5.81)
RDW: 14.3 % (ref 11.5–15.5)
WBC: 14.8 10*3/uL — ABNORMAL HIGH (ref 4.0–10.5)
nRBC: 0 % (ref 0.0–0.2)

## 2023-05-12 LAB — TROPONIN I (HIGH SENSITIVITY)
Troponin I (High Sensitivity): 4 ng/L (ref <18)
Troponin I (High Sensitivity): 4 ng/L (ref <18)

## 2023-05-12 LAB — COMPREHENSIVE METABOLIC PANEL
ALT: 36 U/L (ref 0–44)
AST: 27 U/L (ref 15–41)
Albumin: 5.1 g/dL — ABNORMAL HIGH (ref 3.5–5.0)
Alkaline Phosphatase: 106 U/L (ref 38–126)
Anion gap: 14 (ref 5–15)
BUN: 21 mg/dL — ABNORMAL HIGH (ref 6–20)
CO2: 24 mmol/L (ref 22–32)
Calcium: 9.5 mg/dL (ref 8.9–10.3)
Chloride: 104 mmol/L (ref 98–111)
Creatinine, Ser: 1.14 mg/dL (ref 0.61–1.24)
GFR, Estimated: >60 mL/min (ref >60)
Glucose, Bld: 151 mg/dL — ABNORMAL HIGH (ref 70–99)
Potassium: 4 mmol/L (ref 3.5–5.1)
Sodium: 142 mmol/L (ref 135–145)
Total Bilirubin: 0.7 mg/dL (ref 0.3–1.2)
Total Protein: 8.9 g/dL — ABNORMAL HIGH (ref 6.5–8.1)

## 2023-05-12 LAB — LIPASE, BLOOD: Lipase: 39 U/L (ref 11–51)

## 2023-05-12 MED ORDER — IOHEXOL 300 MG/ML  SOLN
100.0000 mL | Freq: Once | INTRAMUSCULAR | Status: AC | PRN
  Administered 2023-05-12: 100 mL via INTRAVENOUS

## 2023-05-12 MED ORDER — LACTATED RINGERS IV BOLUS
500.0000 mL | Freq: Once | INTRAVENOUS | Status: AC
  Administered 2023-05-12: 500 mL via INTRAVENOUS

## 2023-05-12 MED ORDER — ONDANSETRON HCL 4 MG/2ML IJ SOLN
4.0000 mg | Freq: Once | INTRAMUSCULAR | Status: AC
  Administered 2023-05-12: 4 mg via INTRAVENOUS
  Filled 2023-05-12: qty 2

## 2023-05-12 MED ORDER — METOPROLOL SUCCINATE ER 25 MG PO TB24
25.0000 mg | ORAL_TABLET | Freq: Every day | ORAL | Status: DC
  Administered 2023-05-12: 25 mg via ORAL
  Filled 2023-05-12: qty 1

## 2023-05-12 NOTE — Discharge Instructions (Signed)
Your CT scan showed evidence of likely gastroenteritis.  Your lab work showed an elevated white blood cell count.  I recommend you stay in the hospital due to persistent high heart rate.  You elected to discharge home.  I recommend you follow-up closely with your primary care provider and cardiologist and if you develop any new or worsening symptoms you should return to the ED.

## 2023-05-12 NOTE — ED Triage Notes (Signed)
BIB EMS from home for N/V, abdominal cramping that started today at 1430. EMS gave 4mg  of zofran and 1L of LR and nausea is improved.  Hx of gastritis with hospitalization.

## 2023-05-12 NOTE — ED Provider Notes (Signed)
[place] EMERGENCY DEPARTMENT AT [facility] Provider Note   CSN: [id number] Arrival date & time: 05/12/23  1707     History {Add pertinent medical, surgical, social history, OB history to HPI:1} No chief complaint on file.   Ivan Allison is a 52 y.o. male.  HPI 52 year old male history of type 2 diabetes, GERD, hypertension, hyperlipidemia presenting for nausea and vomiting.  States around 2:30 PM he had relatively sudden onset nausea and multiple episodes of nonbloody and bilious emesis.  He was quite diaphoretic.  He had smoked associated generalized abdominal cramping.  No chest pain or shortness of breath.  No fevers or chills.  He got some fluids and Zofran with EMS which improved his symptoms significantly.  He states he had an episode in June where he was seen at [facility] for for similar symptoms, he was ultimately discharged after normal CT and lab workup.  He said normal bowel movements, no diarrhea or melena or hematochezia.  He is passing gas.  No urinary symptoms.     Home Medications Prior to Admission medications   Medication Sig Start Date End Date Taking? Authorizing Provider  aspirin EC 81 MG tablet Take 81 mg by mouth daily.    [provider]  buPROPion (WELLBUTRIN XL) 150 MG 24 hr tablet Take 150 mg by mouth daily. 02/13/14   [provider]  Cetirizine HCl 10 MG CAPS Take 10 mg by mouth daily.    [provider]  Dexlansoprazole 30 MG capsule Take 1 capsule by mouth every other day. 05/27/16   [provider]  diazepam (VALIUM) 5 MG tablet Take one tablet by mouth 30 minutes prior to planned procedure. 07/25/21   Kathleene Hazel, MD  empagliflozin (JARDIANCE) 10 MG TABS tablet Take 10 mg by mouth daily.    [provider]  escitalopram (LEXAPRO) 10 MG tablet Take 10 mg by mouth daily. 07/04/14   [provider]  fluticasone (FLONASE) 50 MCG/ACT nasal spray Place 2 sprays into both nostrils daily.  11/21/13   [provider]  lisinopril-hydrochlorothiazide (PRINZIDE,ZESTORETIC) 20-12.5 MG per tablet Take 2 tablets by mouth daily. 01/31/14   [provider]  meloxicam (MOBIC) 15 MG tablet Take 15 mg by mouth daily as needed for pain.    [provider]  metFORMIN (GLUCOPHAGE-XR) 500 MG 24 hr tablet Take 2 tablets by mouth 2 (two) times daily. Morning and night 08/16/20   [provider]  metoprolol succinate (TOPROL-XL) 25 MG 24 hr tablet TAKE 1 TABLET (25 MG TOTAL) BY MOUTH DAILY. 09/24/22   Kathleene Hazel, MD  montelukast (SINGULAIR) 10 MG tablet Take 10 mg by mouth daily. 01/22/14   [provider]  Multiple Vitamin (MULTIVITAMIN) tablet Take 1 tablet by mouth daily.    [provider]  Potassium Citrate 15 MEQ (1620 MG) TBCR Take 15 mEq by mouth daily. 12/01/13   [provider]  rosuvastatin (CRESTOR) 40 MG tablet Take 1 tablet (40 mg total) by mouth daily. 07/27/22   Kathleene Hazel, MD  Semaglutide, 2 MG/DOSE, (OZEMPIC, 2 MG/DOSE,) 8 MG/3ML SOPN Inject 2mg  into the skin once weekly. 05/11/22     Testosterone 20.25 MG/ACT (1.62%) GEL 5 g once daily. 11/10/13   [provider]      Allergies    Sulfamethoxazole-trimethoprim    Review of Systems   Review of Systems Review of systems completed and notable as per HPI.  ROS otherwise negative.   Physical Exam Updated  Vital Signs There were no vitals taken for this visit. Physical Exam Vitals and nursing note reviewed.  Constitutional:      General: He is not in acute distress.    Appearance: He is well-developed.  HENT:     Head: Normocephalic and atraumatic.     Nose: Nose normal.     Mouth/Throat:     Mouth: Mucous membranes are moist.     Pharynx: Oropharynx is clear.  Eyes:     Extraocular Movements: Extraocular movements intact.     Conjunctiva/sclera: Conjunctivae normal.     Pupils: Pupils are equal, round, and reactive to light.   Cardiovascular:     Rate and Rhythm: Normal rate and regular rhythm.     Heart sounds: No murmur heard. Pulmonary:     Effort: Pulmonary effort is normal. No respiratory distress.     Breath sounds: Normal breath sounds.  Abdominal:     Palpations: Abdomen is soft.     Tenderness: There is abdominal tenderness. There is no guarding or rebound.  Musculoskeletal:        General: No swelling.     Cervical back: Normal range of motion and neck supple. No rigidity.     Right lower leg: No edema.     Left lower leg: No edema.  Skin:    General: Skin is warm and dry.     Capillary Refill: Capillary refill takes less than 2 seconds.  Neurological:     General: No focal deficit present.     Mental Status: He is alert and oriented to person, place, and time. Mental status is at baseline.  Psychiatric:        Mood and Affect: Mood normal.     ED Results / Procedures / Treatments   Labs (all labs ordered are listed, but only abnormal results are displayed) Labs Reviewed - No data to display  EKG None  Radiology No results found.  Procedures Procedures  {Document cardiac monitor, telemetry assessment procedure when appropriate:1}  Medications Ordered in ED Medications - No data to display  ED Course/ Medical Decision Making/ A&P   {   Click here for ABCD2, HEART and other calculatorsREFRESH Note before signing :1}                              Medical Decision Making Amount and/or Complexity of Data Reviewed Labs: ordered. Radiology: ordered.  Risk Prescription drug management.   Medical Decision Making:   Ivan Allison is a 52 y.o. male who presented to the ED today with nausea, vomiting.  On examination he is mildly tachycardic.  Reports sudden onset nausea and vomiting and diaphoresis.  He has mild abdominal cramping tenderness, although is having regular bowel movements.  Seems less likely obstruction but obtain CT scan to evaluate.  He has no chest pain, EKG with  EMS was nonischemic.  However could be anginal equivalent is wanting troponin, EKG here.  Also treat symptomatically.  He said no chest pain at all, low concern for PE, dissection.  No headache or neurologic symptoms.   {crccomplexity:27900} Reviewed and confirmed nursing documentation for past medical history, family history, social history.  Reassessment and Plan:   ***    Patient's presentation is most consistent with {EM COPA:27473}     {Document critical care time when appropriate:1} {Document review of labs and clinical decision tools ie heart score, Chads2Vasc2 etc:1}  {Document your independent review of radiology  images, and any outside records:1} {Document your discussion with family members, caretakers, and with consultants:1} {Document social determinants of health affecting pt's care:1} {Document your decision making why or why not admission, treatments were needed:1} Final Clinical Impression(s) / ED Diagnoses Final diagnoses:  None    Rx / DC Orders ED Discharge Orders     None

## 2023-05-13 ENCOUNTER — Telehealth: Payer: Self-pay | Admitting: Cardiovascular Disease

## 2023-05-13 NOTE — Telephone Encounter (Signed)
Left message for patient to call back  

## 2023-05-13 NOTE — Telephone Encounter (Signed)
STAT if HR is under 50 or over 120 (normal HR is 60-100 beats per minute)  What is your heart rate? 100-110  Do you have a log of your heart rate readings (document readings)? 130 - yesterday  Do you have any other symptoms? No  Patient is calling to because his resting heart rate was 130 yesterday. Patient stated it has gone down, but his heart rate while resting is still ranging 100-110. Please advise.

## 2023-05-14 NOTE — Telephone Encounter (Signed)
Pt w diarrhea, possible viral infection and in the [facility] ER on 05/12/23.  He was   tachycardic there 130s.  110-120 after receiving fluids.  80s-90s is typical resting rate.  Today, down into 90s.  Loose stools have improved and he is able to stay hydrated.      He is aware to monitor and let us know if HR goes back up.  Adv likely due to the viral illness and dehydration.  He continues on Toprol XL 25 mg daily.  Will call back if any further needs.  His follow up is scheduled as planned for December 2024.

## 2023-05-14 NOTE — Telephone Encounter (Signed)
Pt returning call

## 2023-06-03 ENCOUNTER — Other Ambulatory Visit: Payer: Self-pay | Admitting: Medical Genetics

## 2023-06-03 DIAGNOSIS — Z006 Encounter for examination for normal comparison and control in clinical research program: Secondary | ICD-10-CM

## 2023-06-30 ENCOUNTER — Other Ambulatory Visit: Payer: Self-pay | Admitting: Cardiovascular Disease

## 2023-06-30 ENCOUNTER — Other Ambulatory Visit (HOSPITAL_COMMUNITY): Payer: Self-pay | Attending: Medical Genetics

## 2023-07-28 ENCOUNTER — Other Ambulatory Visit: Payer: Self-pay | Admitting: Cardiovascular Disease

## 2023-08-06 ENCOUNTER — Encounter: Payer: Self-pay | Admitting: Cardiology

## 2023-08-06 ENCOUNTER — Ambulatory Visit: Payer: 59 | Attending: Cardiology | Admitting: Cardiology

## 2023-08-06 VITALS — BP 116/82 | HR 87 | Ht 69.0 in | Wt 258.0 lb

## 2023-08-06 DIAGNOSIS — E782 Mixed hyperlipidemia: Secondary | ICD-10-CM

## 2023-08-06 DIAGNOSIS — I479 Paroxysmal tachycardia, unspecified: Secondary | ICD-10-CM

## 2023-08-06 DIAGNOSIS — I2510 Atherosclerotic heart disease of native coronary artery without angina pectoris: Secondary | ICD-10-CM | POA: Diagnosis not present

## 2023-08-06 DIAGNOSIS — I428 Other cardiomyopathies: Secondary | ICD-10-CM | POA: Diagnosis not present

## 2023-08-06 NOTE — Patient Instructions (Signed)
Medication Instructions:  Your physician recommends that you continue on your current medications as directed. Please refer to the Current Medication list given to you today.  *If you need a refill on your cardiac medications before your next appointment, please call your pharmacy*   Lab Work: None ordered   If you have labs (blood work) drawn today and your tests are completely normal, you will receive your results only by: MyChart Message (if you have MyChart) OR A paper copy in the mail If you have any lab test that is abnormal or we need to change your treatment, we will call you to review the results.   Testing/Procedures: None ordered    Follow-Up: At [facility], you and your health needs are our priority.  As part of our continuing mission to provide you with exceptional heart care, we have created designated Provider Care Teams.  These Care Teams include your primary Cardiologist (physician) and Advanced Practice Providers (APPs -  Physician Assistants and Nurse Practitioners) who all work together to provide you with the care you need, when you need it.  We recommend signing up for the patient portal called "MyChart".  Sign up information is provided on this After Visit Summary.  MyChart is used to connect with patients for Virtual Visits (Telemedicine).  Patients are able to view lab/test results, encounter notes, upcoming appointments, etc.  Non-urgent messages can be sent to your provider as well.   To learn more about what you can do with MyChart, go to [email or web address].    Your next appointment:   12 month(s)  Provider:   Christopher McAlhany, MD     Other Instructions   

## 2023-08-06 NOTE — Progress Notes (Signed)
Cardiology Office Note:   Date:  08/06/2023  ID:  Ivan Allison, DOB [birth year], MRN [id number] PCP: Tally Joe, MD  [facility] Providers Cardiologist:  Verne Carrow, MD    History of Present Illness:   Discussed the use of AI scribe software for clinical note transcription with the patient, who gave verbal consent to proceed.  History of Present Illness   The patient, a 52 year old individual with a medical history of asthma, diabetes, depression, hyperlipidemia, hypertension, sleep apnea, non-ischemic cardiomyopathy, and gastroesophageal reflux disease, initially presented in November 2022 for evaluation of exertional dyspnea. At that time, he had no cardiac history and reported no chest pain or pressure. An echocardiogram revealed a left ventricular ejection fraction of 40-45%, with no valve disease. A CT cardiac calcium score was zero, and coronary CTA showed minimal plaque in the LAD.  Over the past year, the patient has been generally well, with one notable episode of persistent nausea and vomiting in October. This episode led to an ER visit, where he received fluids and Zofran, which resolved the symptoms. However, during this episode, the patient experienced an elevated heart rate of around 120, which persisted longer than expected and prompted ED to encourage cardiology follow up. By the next day, the heart rate had returned to the 90s, which is baseline for patient.   The patient has been adhering to his medication regimen, which includes Ozempic for diabetes. He reported an improvement in his A1C from the 7s to 6.5 since starting Ozempic. He also noted a reduction in triglycerides from around 250 to 200, which he attributes to weight loss from Ozempic and a switch from Lipitor to rosuvastatin.  The patient denies any recent chest pain, shortness of breath, palpitations, swelling, or lightheadedness. He has been maintaining an exercise routine of neighborhood walks,  which he tolerates well. He continues to take daily aspirin following a CT angiogram that showed mild soft plaque in the LAD. He reports no GI issues or bloody stools associated with the aspirin.      Studies Reviewed:    EKG:   EKG Interpretation Date/Time:  Friday August 06 2023 11:49:17 EST Ventricular Rate:  87 PR Interval:  184 QRS Duration:  82 QT Interval:  364 QTC Calculation: 438 R Axis:   -9  Text Interpretation: Normal sinus rhythm Normal ECG When compared with ECG of 12-May-2023 17:36, PREVIOUS ECG IS PRESENT Confirmed by Perlie Gold [phone]) on 08/06/2023 11:50:41 AM    07/30/22 TTE  IMPRESSIONS     1. Left ventricular ejection fraction, by estimation, is 50 to 55%. The  left ventricle has low normal function. The left ventricle has no regional  wall motion abnormalities. Left ventricular diastolic parameters are  consistent with Grade I diastolic  dysfunction (impaired relaxation).   2. Right ventricular systolic function is normal. The right ventricular  size is normal.   3. The mitral valve is normal in structure. No evidence of mitral valve  regurgitation. No evidence of mitral stenosis.   4. The aortic valve was not well visualized. Aortic valve regurgitation  is not visualized. No aortic stenosis is present.   5. There is borderline dilatation of the ascending aorta, measuring 37  mm. There is borderline dilatation of the aortic root, measuring 39 mm.   6. The inferior vena cava is normal in size with greater than 50%  respiratory variability, suggesting right atrial pressure of 3 mmHg.   FINDINGS   Left Ventricle: Left ventricular ejection fraction, by estimation,  is 50  to 55%. The left ventricle has low normal function. The left ventricle has  no regional wall motion abnormalities. Definity contrast agent was given  IV to delineate the left  ventricular endocardial borders. The left ventricular internal cavity size  was normal in size. There is  no left ventricular hypertrophy. Left  ventricular diastolic parameters are consistent with Grade I diastolic  dysfunction (impaired relaxation).   Right Ventricle: The right ventricular size is normal. No increase in  right ventricular wall thickness. Right ventricular systolic function is  normal.   Left Atrium: Left atrial size was normal in size.   Right Atrium: Right atrial size was normal in size.   Pericardium: There is no evidence of pericardial effusion.   Mitral Valve: The mitral valve is normal in structure. There is mild  calcification of the mitral valve leaflet(s). No evidence of mitral valve  regurgitation. No evidence of mitral valve stenosis.   Tricuspid Valve: The tricuspid valve is normal in structure. Tricuspid  valve regurgitation is mild . No evidence of tricuspid stenosis.   Aortic Valve: The aortic valve was not well visualized. Aortic valve  regurgitation is not visualized. No aortic stenosis is present. Aortic  valve mean gradient measures 3.0 mmHg. Aortic valve peak gradient measures  5.0 mmHg. Aortic valve area, by VTI  measures 3.91 cm.   Pulmonic Valve: The pulmonic valve was normal in structure. Pulmonic valve  regurgitation is not visualized. No evidence of pulmonic stenosis.   Aorta: The aortic root is normal in size and structure. There is  borderline dilatation of the ascending aorta, measuring 37 mm. There is  borderline dilatation of the aortic root, measuring 39 mm.   Venous: The inferior vena cava is normal in size with greater than 50%  respiratory variability, suggesting right atrial pressure of 3 mmHg.   IAS/Shunts: No atrial level shunt detected by color flow Doppler.   Risk Assessment/Calculations:              Physical Exam:   VS:  BP 116/82   Pulse 87   Ht 5\' 9"  (1.753 m)   Wt 258 lb (117 kg)   SpO2 97%   BMI 38.10 kg/m    Wt Readings from Last 3 Encounters:  08/06/23 258 lb (117 kg)  05/12/23 250 lb (113.4 kg)   10/12/22 250 lb (113.4 kg)     Physical Exam Vitals reviewed.  Constitutional:      Appearance: Normal appearance.  HENT:     Head: Normocephalic.  Eyes:     Pupils: Pupils are equal, round, and reactive to light.  Cardiovascular:     Rate and Rhythm: Normal rate and regular rhythm.     Pulses: Normal pulses.     Heart sounds: Normal heart sounds.  Pulmonary:     Effort: Pulmonary effort is normal.     Breath sounds: Normal breath sounds.  Abdominal:     General: Abdomen is flat.     Palpations: Abdomen is soft.  Musculoskeletal:     Right lower leg: No edema.     Left lower leg: No edema.  Skin:    General: Skin is warm and dry.     Capillary Refill: Capillary refill takes less than 2 seconds.  Neurological:     General: No focal deficit present.     Mental Status: He is alert and oriented to person, place, and time.  Psychiatric:        Mood and Affect:  Mood normal.        Behavior: Behavior normal.        Thought Content: Thought content normal.        Judgment: Judgment normal.       ASSESSMENT AND PLAN:     Assessment and Plan    Non-Ischemic Cardiomyopathy Non-ischemic cardiomyopathy with previously reduced LVEF now recovered. Asymptomatic with no recent chest pain, dyspnea, palpitations, or edema. Continued on guideline-directed medical therapy. Discussed benefits of current medications in maintaining cardiac function and preventing heart failure symptoms, with significant improvement in LVEF and overall cardiac health. - Continue Jardiance 10 mg daily - Continue Metoprolol Succinate 25 mg daily - Continue Lisinopril/Hydrochlorothiazide 20/12.5 mg daily  Coronary Artery Disease without Angina Coronary CTA in December 2022 showed minimal early LAD plaque. No angina or dyspnea. Continued on aspirin and statin therapy. Discussed aspirin's role in preventing cardiac events despite minimal plaque and the importance of statin in managing cholesterol levels.  Explained potential risks of aspirin, including GI bleeding, and the need to monitor for adverse effects. - Continue Aspirin - Continue Rosuvastatin  Tachycardia Patient with prolonged episode of tachycardia in the setting of significant GI upset prompting ED visit in October. Patient subsequently returned to normal rates after symptoms improved. ECG normal today in clinic. No concern for arrhythmia. ED episode consistent with typical stress response.  Hyperlipidemia LDL cholesterol well-controlled at 49 mg/dL on Rosuvastatin. Triglycerides slightly elevated but improved with weight loss and A1c control on Ozempic. Discussed potential need for fenofibrate if triglycerides remain elevated despite current management.  - Continue Rosuvastatin - Monitor triglycerides and consider fenofibrate if persistent elevation  Diabetes Mellitus A1c improved to 6.5% with Ozempic, previously in the 7s. - Continue follow up with Dr. Azucena Cecil.  General Health Maintenance Maintaining a walking routine for cardiovascular health. No recent chest pain, shortness of breath, or other concerning symptoms. No GI issues with aspirin. Discussed importance of regular physical activity in maintaining cardiovascular health and monitoring for GI symptoms related to aspirin use. - Encourage continuation of walking routine - Monitor for any GI issues related to aspirin  Follow-up - Follow up in 1 year           Signed, Perlie Gold, PA-C

## 2023-09-30 ENCOUNTER — Other Ambulatory Visit: Payer: Self-pay | Admitting: Cardiovascular Disease

## 2023-10-06 IMAGING — CT CT HEART MORP W/ CTA COR W/ SCORE W/ CA W/CM &/OR W/O CM
1 of 2 series · 4 of 12 positions shown, 5 images · non-contrast
Comparison: Prior calcium score study on 07/22/2021
COMPARISON: Prior calcium score study on 07/22/2021

Addendum:
EXAM:
OVER-READ INTERPRETATION  CT CHEST

The following report is an over-read performed by radiologist Dr.
Edelmo Av [[email or web address]] on 07/29/2021. This
over-read does not include interpretation of cardiac or coronary
anatomy or pathology. The coronary CTA interpretation by the
cardiologist is attached.
CLINICAL DATA: 50 Year old White  Male
Cardiac/Coronary  CTA
TECHNIQUE: The patient was scanned on a [employer] Force scanner.

[Series 812: — · 0.31mm/px · 4 of 6 slices shown, 5 images]
[im 2/6  vessel]
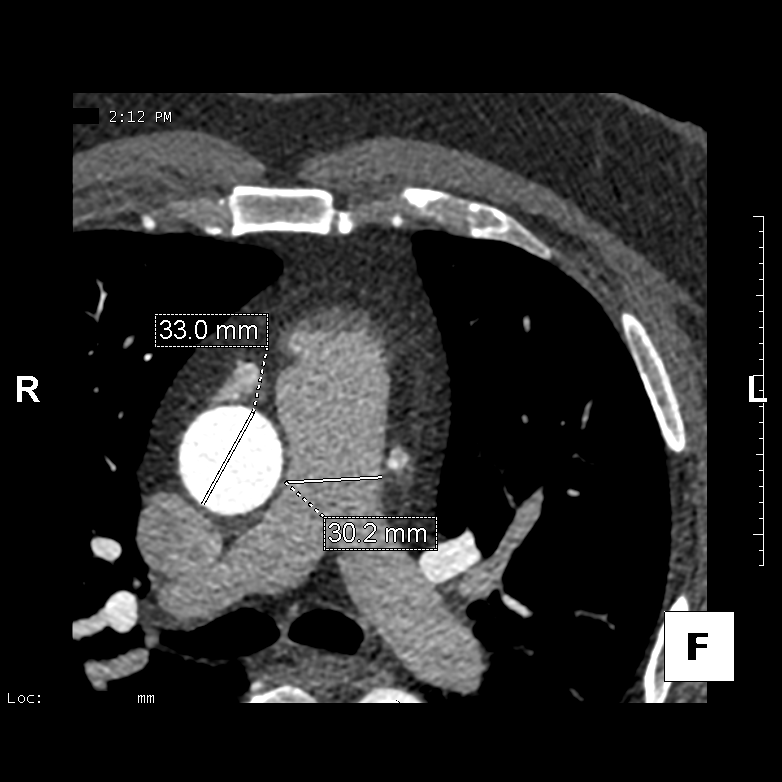
[im 2/6  lung]
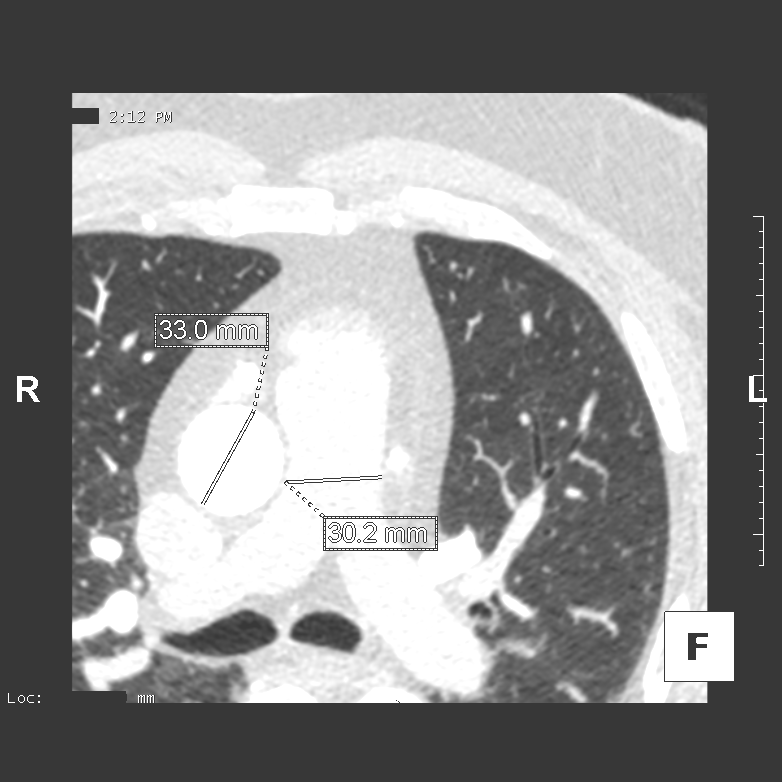
[im 3/6  vessel]
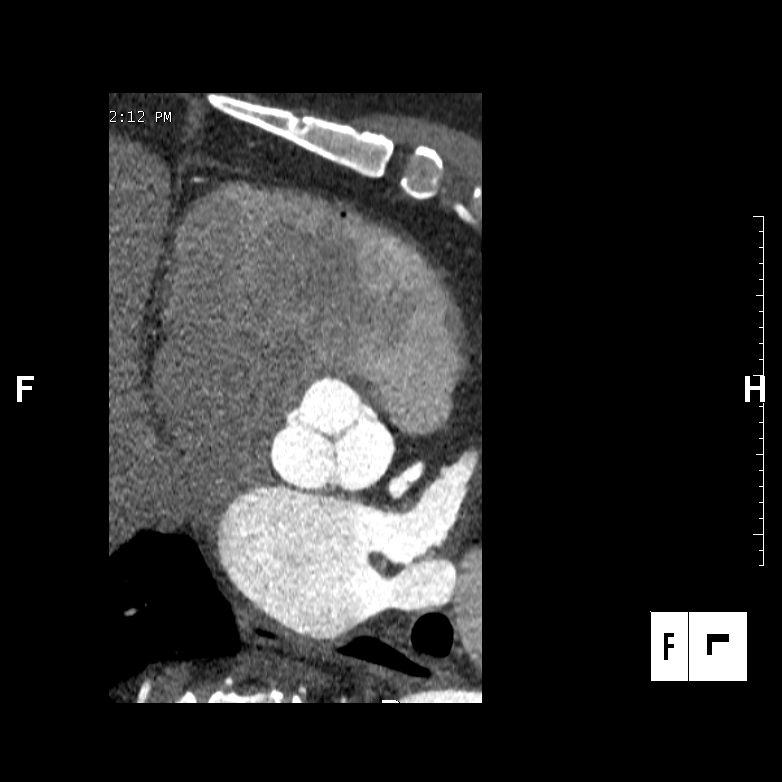
[im 4/6  vessel]
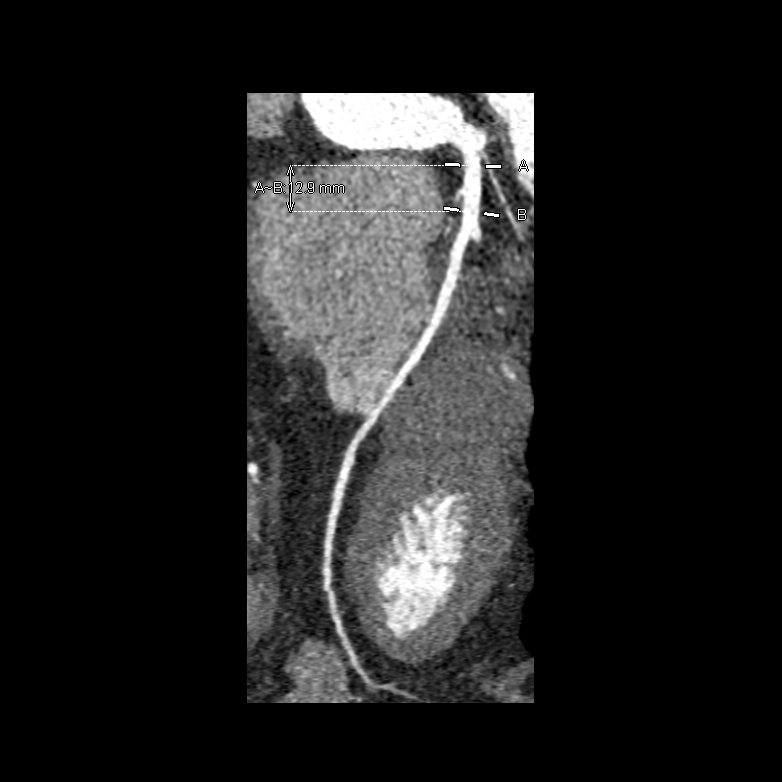
[im 5/6  vessel]
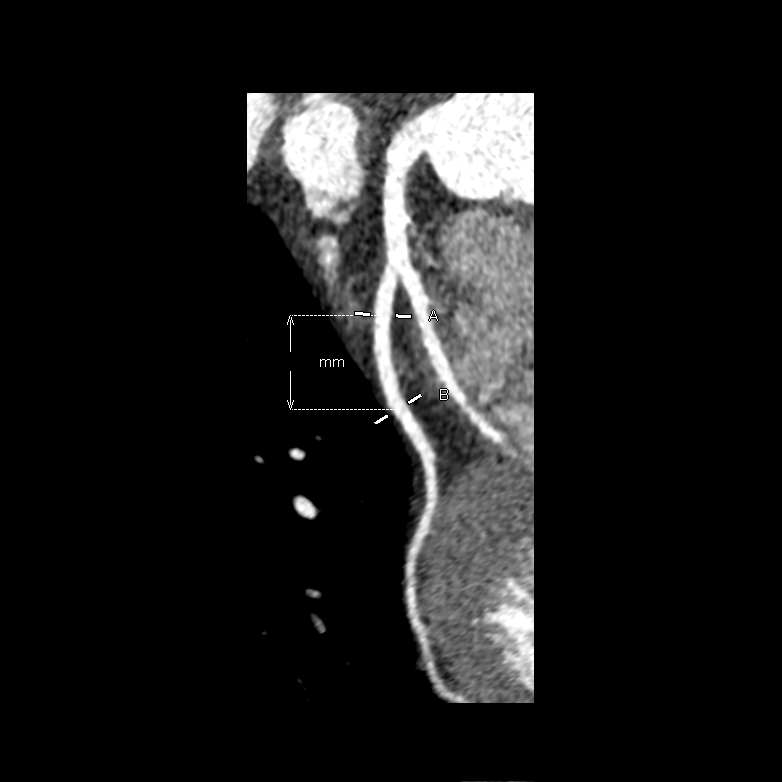

[4 of 12 positions shown; findings below may reference images not displayed]

FINDINGS: Vascular: No significant noncardiac vascular findings.

Mediastinum/Nodes: Visualized mediastinum and hilar regions
demonstrate no lymphadenopathy or masses.

Lungs/Pleura: Visualized lungs show no evidence of pulmonary edema,
consolidation, pneumothorax, nodule or pleural fluid.

Upper Abdomen: No acute abnormality.

Musculoskeletal: No chest wall mass or suspicious bone lesions
identified.
IMPRESSION: No significant incidental findings.
FINDINGS: Scan was triggered in the descending thoracic aorta. Axial
non-contrast 3 mm slices were carried out through the heart. The
data set was analyzed on a dedicated work station and scored using
the Agatson method. Gantry rotation speed was 250 msecs and
collimation was .6 mm. 0.8 mg of sl NTG was given. The 3D data set
was reconstructed in 5% intervals of the 67-82 % of the R-R cycle.
Diastolic phases were analyzed on a dedicated work station using
MPR, MIP and VRT modes. The patient received 100 cc of contrast.

Aorta:  Normal size.  No calcifications.  No dissection.

Main Pulmonary Artery: Normal size of the pulmonary artery.

Aortic Valve:  Tri-leaflet.  No calcifications.

Coronary Arteries:  Normal coronary origin.  Left dominance.

Coronary Calcium Score:

Left main: 0

Left anterior descending artery: 0

Left circumflex artery: 0

Right coronary artery: 0

Ramus Intermedius: 0

Total: 0

Percentile: 1st for age, sex, and race matched control.

RCA is a small non-dominant artery. There is no significant plaque.

Left main is a large artery that gives rise to LAD, RI, LCX
arteries. There is no significant plaque.

LAD is a large vessel that gives rise to one large D1 Branch.
Minimal non obstructive soft plaque in the proximal vessel.

LCX is a non-dominant artery that gives rise to one two OM branches.
There is no significant plaque.

There is a ramus intermedius vessel (small vessel). There is no
significant plaque.

Other findings:

Normal pulmonary vein drainage into the left atrium.

Normal left atrial appendage without a thrombus.

Extra-cardiac findings: See attached radiology report for
non-cardiac structures.

There is a coronary motion artifact. Diagnostic images despite this.
IMPRESSION: 1. Coronary calcium score of 0. This was 1st percentile for age,
sex, and race matched control.

2. Normal coronary origin with left dominance.

3. CAD-RADS 1. Minimal non-obstructive CAD (1-24%). Consider
non-atherosclerotic causes of chest pain. Consider preventive
therapy and risk factor modification.

RECOMMENDATIONS:



If CAC = 0, it is reasonable to withhold statin therapy and reassess
in 5 to 10 years, as long as higher risk conditions are absent
(diabetes mellitus, family history of premature CHD in first degree
relatives (males <55 years; females <65 years), cigarette smoking,
LDL >=190 mg/dL or other independent risk factors).

If CAC is 1 to 99, it is reasonable to initiate statin therapy for
patients >=55 years of age.

If CAC is >=100 or >=75th percentile, it is reasonable to initiate
statin therapy at any age.

Cardiology referral should be considered for patients with CAC
scores =400 or >=75th percentile.

*5364 AHA/ACC/AACVPR/AAPA/ABC/MOTA/CYNTHIA/TAHIRI/Pravia/RACE/HEMON/CYADRAOS
Guideline on the Management of Blood Cholesterol: A Report of the
American College of Cardiology/American Heart Association Task Force
on Clinical Practice Guidelines. J Am Coll Cardiol.
1632;73(24):6089-6702.

*** End of Addendum ***
EXAM:
OVER-READ INTERPRETATION  CT CHEST

The following report is an over-read performed by radiologist Dr.
Edelmo Av [REDACTED] on 07/29/2021. This
over-read does not include interpretation of cardiac or coronary
anatomy or pathology. The coronary CTA interpretation by the
cardiologist is attached.
FINDINGS: Vascular: No significant noncardiac vascular findings.

Mediastinum/Nodes: Visualized mediastinum and hilar regions
demonstrate no lymphadenopathy or masses.

Lungs/Pleura: Visualized lungs show no evidence of pulmonary edema,
consolidation, pneumothorax, nodule or pleural fluid.

Upper Abdomen: No acute abnormality.

Musculoskeletal: No chest wall mass or suspicious bone lesions
identified.
IMPRESSION: No significant incidental findings.

## 2023-10-14 ENCOUNTER — Encounter: Payer: Self-pay | Admitting: *Deleted

## 2023-10-14 ENCOUNTER — Encounter: Payer: Self-pay | Admitting: Neurology

## 2023-10-14 ENCOUNTER — Ambulatory Visit: Payer: 59 | Admitting: Neurology

## 2023-10-14 VITALS — BP 102/67 | HR 96 | Ht 70.0 in | Wt 259.2 lb

## 2023-10-14 DIAGNOSIS — Z9981 Dependence on supplemental oxygen: Secondary | ICD-10-CM

## 2023-10-14 DIAGNOSIS — G4734 Idiopathic sleep related nonobstructive alveolar hypoventilation: Secondary | ICD-10-CM | POA: Diagnosis not present

## 2023-10-14 DIAGNOSIS — G4733 Obstructive sleep apnea (adult) (pediatric): Secondary | ICD-10-CM

## 2023-10-14 DIAGNOSIS — Z9989 Dependence on other enabling machines and devices: Secondary | ICD-10-CM

## 2023-10-14 DIAGNOSIS — I420 Dilated cardiomyopathy: Secondary | ICD-10-CM | POA: Diagnosis not present

## 2023-10-14 NOTE — Progress Notes (Signed)
 Provider:  Melvyn Novas, MD  Primary Care Physician:  Tally Joe, MD [phone] [place] [address]     Referring Provider: Tally Joe, Md [address] [place],  [place] [address]          Chief Complaint according to patient   Patient presents with:                HISTORY OF PRESENT ILLNESS:    Chief concern according to patient :  none  Ivan Allison is a 53 y.o. male patient who is here for revisit 10/14/2023 for OSA on CPAP.  Mr Drudge has carried a dx of cardiomyopathy, non ischemic, and reached a recovery of 50% EF . He started metoprolol for tachycardia ( resting heart rate 90)  , feels good.   He  has been on 02, Ozempic helped with weight loss.   ' See download below.  100% compliance, residual AHI is 0.7/h and he can't sleep without it.   He is           10-09-2021:   Ivan Allison is a 53 year -old  male patient seen here in his first RV after receiving a new CPAP machine-  10/09/2021 . CPAP dependent patient after 15 years of OSA. He was extremely fatigued, often Short of breath and deconditioned. He had no cardiac history at that time. In the mean time we worked him up for OSA, found severe apnea, and hypoxia. He was seen by Cardiologist Dr. Dicie Beam     Mr. Rathe underwent a home sleep test which was on 11-17 2022.  His compliance reports from his CPAP at home are also available the patient was considered CPAP dependent.  He was 100% compliant user of an AutoSet CPAP with a residual AHI of only 1.6/h using a pressure range between 5 and 12 cmH2O with 2 cm expiratory pressure relief.  The patient's air leaks were moderate.  His home sleep test confirmed that severe obstructive apnea was still present it was not REM sleep dependent apnea but associated with significant hypoxemia the lowest oxygen saturation or nadir was at 79% and the patient presented with 26% of the total recorded sleep time in  hypoxia.  So after he received a new auto titration CPAP device by [employer] with a setting between 5 and 15 cmH2O with 3 cm expiratory pressure relief mask of his choice heated humidification.  Also for the forwarded an order for an overnight pulse oximetry.  The compliance on his new CPAP is again excellent 100% for days and time with an average use at time of 9 hours and 47 minutes the residual AHI is 1.1/h no central apneas seem to arise, air leakage is still moderate, 95th percentile pressure is 9.6 cm water.  So on the settings side, nothing had to change.     What did however return abnormal versus overnight pulse oximetry which counts continued to show hypoxia while the patient was on CPAP.  Results were posted 08-07-2021.         Review of Systems: Out of a complete 14 system review, the patient complains of only the following symptoms, and all other reviewed systems are negative.:   ESS 1/ 24, FSS at 12/ 63 points.     Social History   Socioeconomic History   Marital status: Married    Spouse name: Darl Pikes   Number of children: Not on  file   Years of education: Not on file   Highest education level: Not on file  Occupational History   Occupation: Medical Coding  Tobacco Use   Smoking status: Never   Smokeless tobacco: Never  Vaping Use   Vaping status: Not on file  Substance and Sexual Activity   Alcohol use: Yes    Alcohol/week: 0.0 standard drinks of alcohol    Comment: rarely   Drug use: No   Sexual activity: Not on file  Other Topics Concern   Not on file  Social History Narrative   Right handed   Caffeine 1 cup avg daily.      Student/[employer], Working on [employer].  Married, no kids.     Pt lives with wife    Pt works    Social Drivers of Corporate investment banker Strain: Not on [employer] Insecurity: Not on file  Transportation Needs: Not on file  Physical Activity: Not on file  Stress: Not on file  Social Connections: Unknown (12/19/2021)    Received from [employer] [facility]   Social Network    Social Network: Not on file    Family History  Problem Relation Age of Onset   Diabetes Mellitus I Mother    CAD Mother    Cancer - Other Father        Liver   Sleep apnea Neg Hx     Past Medical History:  Diagnosis Date   Abdominal pain    Adenoma of cecum    Allergic rhinitis    Asthma    allergic asthmatic with medical treatment   BMI 40.0-44.9, adult (HCC)    Calculus of gallbladder and bile duct w/o cholecystitis or obstruction    Depression    Diabetes mellitus without complication (HCC)    Displacement of lumbar intervertebral disc    DM (diabetes mellitus) (HCC)    Elevated LDL cholesterol level    Fatty infiltration of liver    GERD (gastroesophageal reflux disease)    High cholesterol    History of kidney stones    Hypercholesterolemia    Hypertension    Obesity    OSA (obstructive sleep apnea)    Reflux esophagitis    Renal stones 07/2016    Past Surgical History:  Procedure Laterality Date   APPENDECTOMY  2022   EXTRACORPOREAL SHOCK WAVE LITHOTRIPSY     GALLBLADDER SURGERY  2021   removed     Current Outpatient Medications on File Prior to Visit  Medication Sig Dispense Refill   albuterol (VENTOLIN HFA) 108 (90 Base) MCG/ACT inhaler Inhale into the lungs.     aspirin EC 81 MG tablet Take 81 mg by mouth daily.     buPROPion (WELLBUTRIN XL) 150 MG 24 hr tablet Take 150 mg by mouth daily.     Cetirizine HCl 10 MG CAPS Take 10 mg by mouth daily.     Cholecalciferol 125 MCG (5000 UT) capsule Take by mouth.     Coenzyme Q10 200 MG capsule Take by mouth.     Dexlansoprazole 30 MG capsule Take 1 capsule by mouth every other day.     diazepam (VALIUM) 5 MG tablet Take one tablet by mouth 30 minutes prior to planned procedure. 1 tablet 0   empagliflozin (JARDIANCE) 10 MG TABS tablet Take 10 mg by mouth daily.     escitalopram (LEXAPRO) 10 MG tablet Take 10 mg by mouth daily.     fluticasone  (FLONASE)  50 MCG/ACT nasal spray Place 2 sprays into both nostrils daily.     fluticasone (FLOVENT HFA) 110 MCG/ACT inhaler Inhale into the lungs.     ketoconazole (NIZORAL) 2 % cream 1 application Externally Twice a day as needed for 14 days     lisinopril-hydrochlorothiazide (PRINZIDE,ZESTORETIC) 20-12.5 MG per tablet Take 1 tablet by mouth daily.     meloxicam (MOBIC) 15 MG tablet Take 15 mg by mouth daily as needed for pain.     metFORMIN (GLUCOPHAGE-XR) 500 MG 24 hr tablet Take 2 tablets by mouth 2 (two) times daily. Morning and night     metoprolol succinate (TOPROL-XL) 25 MG 24 hr tablet TAKE 1 TABLET (25 MG TOTAL) BY MOUTH DAILY. 15 tablet 0   montelukast (SINGULAIR) 10 MG tablet Take 10 mg by mouth daily.     Multiple Vitamin (MULTIVITAMIN) tablet Take 1 tablet by mouth daily.     Olopatadine HCl 0.2 % SOLN Apply to eye.     oxyCODONE-acetaminophen (PERCOCET/ROXICET) 5-325 MG tablet 1 tablet every 4 (four) hours as needed for moderate pain (4-6).     OZEMPIC, 1 MG/DOSE, 4 MG/3ML SOPN INJECT 1MG  SUBCUTANEOUSLY ONCE A WEEK for 84 days     Potassium Citrate 15 MEQ (1620 MG) TBCR Take 15 mEq by mouth daily.     rosuvastatin (CRESTOR) 40 MG tablet TAKE 1 TABLET BY MOUTH EVERY DAY 90 tablet 2   testosterone cypionate (DEPOTESTOSTERONE CYPIONATE) 200 MG/ML injection 0.5 mL Intramuscular 0.5 mg/ml per week     No current facility-administered medications on file prior to visit.    Allergies  Allergen Reactions   Sulfamethoxazole-Trimethoprim Hives and Other (See Comments)     DIAGNOSTIC DATA (LABS, IMAGING, TESTING) - I reviewed patient records, labs, notes, testing and imaging myself where available.  Lab Results  Component Value Date   WBC 14.8 (H) 05/12/2023   HGB 16.0 05/12/2023   HCT 50.0 05/12/2023   MCV 84.6 05/12/2023   PLT 246 05/12/2023      Component Value Date/Time   NA 142 05/12/2023 1750   NA 139 07/09/2021 1102   K 4.0 05/12/2023 1750   CL 104 05/12/2023 1750    CO2 24 05/12/2023 1750   GLUCOSE 151 (H) 05/12/2023 1750   BUN 21 (H) 05/12/2023 1750   BUN 14 07/09/2021 1102   CREATININE 1.14 05/12/2023 1750   CALCIUM 9.5 05/12/2023 1750   PROT 8.9 (H) 05/12/2023 1750   PROT 7.5 11/05/2021 0759   ALBUMIN 5.1 (H) 05/12/2023 1750   ALBUMIN 4.8 11/05/2021 0759   AST 27 05/12/2023 1750   ALT 36 05/12/2023 1750   ALKPHOS 106 05/12/2023 1750   BILITOT 0.7 05/12/2023 1750   BILITOT 0.4 11/05/2021 0759   GFRNONAA >60 05/12/2023 1750   GFRAA  09/04/2010 1055    >60        The eGFR has been calculated using the MDRD equation. This calculation has not been validated in all clinical situations. eGFR's persistently <60 mL/min signify possible Chronic Kidney Disease.   Lab Results  Component Value Date   CHOL 113 11/05/2021   HDL 32 (L) 11/05/2021   LDLCALC 54 11/05/2021   TRIG 156 (H) 11/05/2021   CHOLHDL 3.5 11/05/2021   No results found for: "HGBA1C" No results found for: "VITAMINB12" No results found for: "TSH"  PHYSICAL EXAM:  Today's Vitals   10/14/23 1319  BP: 102/67  Pulse: 96  Weight: 259 lb 3.2 oz (117.6 kg)  Height: 5\' 10"  (1.778  m)   Body mass index is 37.19 kg/m.   Wt Readings from Last 3 Encounters:  10/14/23 259 lb 3.2 oz (117.6 kg)  08/06/23 258 lb (117 kg)  05/12/23 250 lb (113.4 kg)     Ht Readings from Last 3 Encounters:  10/14/23 5\' 10"  (1.778 m)  08/06/23 5\' 9"  (1.753 m)  05/12/23 5\' 9"  (1.753 m)      General: The patient is awake, alert and appears not in acute distress. The patient is awake, alert and appears not in acute distress. The patient is well groomed. Head: Normocephalic, atraumatic. Neck is supple. Mallampati 3 plus- ,  neck circumference: 19.5 " inches .  Nasal airflow  patent.   Retrognathia is seen.  Facial hair .   Dental status: intact  Cardiovascular:  Regular rate and cardiac rhythm by pulse,  without distended neck veins. Respiratory: Lungs are clear to auscultation.  Skin:   Without evidence of ankle edema, or rash. Trunk: abdominal obesity  Neurologic exam : The patient is awake and alert, oriented to place and time.   Memory subjective described as intact.  Attention span & concentration ability appears normal.  Speech is fluent,  without  dysarthria, dysphonia or aphasia.  Mood and affect are appropriate.   Cranial nerves: no loss of smell or taste reported  Pupils are equal and briskly reactive to light.  Funduscopic exam deferred.   Extraocular movements in vertical and horizontal planes were intact and without nystagmus. No Diplopia. Visual fields by finger perimetry are intact. Hearing was intact to soft voice and finger rubbing.  Facial sensation intact to fine touch.Facial motor strength is symmetric and tongue and uvula move midline.  Neck ROM : rotation, tilt and flexion extension were normal for age and shoulder shrug was symmetrical.    Motor exam:  restless, moving his feet and legs. Symmetric bulk, tone and ROM.   Normal tone without cog- wheeling, symmetric grip strength .right knee pain.    Gait and station/ Deep tendon reflexes: in the upper and lower extremities are symmetric and intact.  Babinski response was deferred.      ASSESSMENT AND PLAN 53 y.o. year- old male  here with:    1)  compliance visit for OSA on CPAP , used woth 2 liters of oxygen.   Non-ischaemic Cardiomyopathy has improved, EF reached 50% now.   Patient is CPAP dependent , neither fatigued not EDS.    I plan to follow up either personally or through our NP within 12 months.   I would like to thank Tally Joe, Md [address] [place],  [place] [address] for allowing me to meet with and to take care of this pleasant patient.     After spending a total time of  25  minutes face to face and additional time for physical and neurologic examination, review of laboratory studies,  personal review of imaging studies, reports and results of other  testing and review of referral information / records as far as provided in visit,   Electronically signed by: Melvyn Novas, MD 10/14/2023 1:56 PM  [facility] Board certified by The [employer] of Sleep Medicine and Diplomate of the [employer] of Sleep Medicine. Board certified In Neurology through the ABPN, Fellow of the [employer] of Neurology.

## 2023-11-04 ENCOUNTER — Encounter: Payer: Self-pay | Admitting: Cardiovascular Disease

## 2023-11-05 ENCOUNTER — Other Ambulatory Visit (HOSPITAL_COMMUNITY): Payer: Self-pay

## 2023-11-05 ENCOUNTER — Other Ambulatory Visit: Payer: Self-pay

## 2023-11-05 MED ORDER — METOPROLOL SUCCINATE ER 25 MG PO TB24
25.0000 mg | ORAL_TABLET | Freq: Every day | ORAL | 3 refills | Status: AC
Start: 2023-11-05 — End: ?
  Filled 2023-11-05: qty 30, 30d supply, fill #0

## 2023-11-17 ENCOUNTER — Other Ambulatory Visit (HOSPITAL_COMMUNITY)
Admission: RE | Admit: 2023-11-17 | Discharge: 2023-11-17 | Disposition: A | Discharge status: Home / Self Care | Payer: Self-pay | Source: Ambulatory Visit | Attending: Medical Genetics | Admitting: Medical Genetics

## 2023-11-17 DIAGNOSIS — Z006 Encounter for examination for normal comparison and control in clinical research program: Secondary | ICD-10-CM | POA: Insufficient documentation

## 2023-11-28 LAB — GENECONNECT MOLECULAR SCREEN: Genetic Analysis Overall Interpretation: NEGATIVE

## 2024-06-28 ENCOUNTER — Other Ambulatory Visit: Payer: Self-pay | Admitting: Cardiology

## 2024-08-05 ENCOUNTER — Encounter: Payer: Self-pay | Admitting: Cardiovascular Disease

## 2024-08-22 ENCOUNTER — Ambulatory Visit: Admitting: Cardiovascular Disease

## 2024-08-22 ENCOUNTER — Encounter: Payer: Self-pay | Admitting: Cardiovascular Disease

## 2024-08-22 VITALS — BP 104/78 | HR 81 | Ht 70.0 in | Wt 257.9 lb

## 2024-08-22 DIAGNOSIS — E782 Mixed hyperlipidemia: Secondary | ICD-10-CM

## 2024-08-22 DIAGNOSIS — I428 Other cardiomyopathies: Secondary | ICD-10-CM

## 2024-08-22 DIAGNOSIS — I2510 Atherosclerotic heart disease of native coronary artery without angina pectoris: Secondary | ICD-10-CM | POA: Diagnosis not present

## 2024-08-22 NOTE — Progress Notes (Signed)
 "   Chief Complaint  Patient presents with   Follow-up    CAD   History of Present Illness: 54 yo male with history of CAD, DM, HLD, HTN, sleep apnea, non-ischemic cardiomyopathy and GERD here today for follow up. He was seen as a new consult in 2022 for evaluation of dyspnea. Echo 07/02/21 with LVEF=40-45%. No valve disease. CT cardiac calcium  score of zero. Coronary CTA 07/29/21 with minimal plaque in the LAD. Echo December 2023 with LVEF=50-55%.   He is here today for follow up. The patient denies any chest pain, dyspnea, palpitations, lower extremity edema, orthopnea, PND, dizziness, near syncope or syncope.   Primary Care Physician: Seabron Lenis, MD  Past Medical History:  Diagnosis Date   Abdominal pain    Adenoma of cecum    Allergic rhinitis    Asthma    allergic asthmatic with medical treatment   BMI 40.0-44.9, adult (HCC)    Calculus of gallbladder and bile duct w/o cholecystitis or obstruction    Depression    Diabetes mellitus without complication (HCC)    Displacement of lumbar intervertebral disc    DM (diabetes mellitus) (HCC)    Elevated LDL cholesterol level    Fatty infiltration of liver    GERD (gastroesophageal reflux disease)    High cholesterol    History of kidney stones    Hypercholesterolemia    Hypertension    Obesity    OSA (obstructive sleep apnea)    Reflux esophagitis    Renal stones 07/2016    Past Surgical History:  Procedure Laterality Date   APPENDECTOMY  2022   EXTRACORPOREAL SHOCK WAVE LITHOTRIPSY     GALLBLADDER SURGERY  2021   removed    Current Outpatient Medications  Medication Sig Dispense Refill   albuterol (VENTOLIN HFA) 108 (90 Base) MCG/ACT inhaler Inhale into the lungs.     aspirin EC 81 MG tablet Take 81 mg by mouth daily.     buPROPion (WELLBUTRIN XL) 150 MG 24 hr tablet Take 150 mg by mouth daily.     Cetirizine HCl 10 MG CAPS Take 10 mg by mouth daily.     Cholecalciferol 125 MCG (5000 UT) capsule Take by mouth.      Coenzyme Q10 200 MG capsule Take by mouth.     Dexlansoprazole 30 MG capsule Take 1 capsule by mouth every other day.     diazepam  (VALIUM ) 5 MG tablet Take one tablet by mouth 30 minutes prior to planned procedure. 1 tablet 0   empagliflozin (JARDIANCE) 10 MG TABS tablet Take 10 mg by mouth daily.     escitalopram (LEXAPRO) 10 MG tablet Take 10 mg by mouth daily.     Eszopiclone 3 MG TABS Take 3 mg by mouth at bedtime as needed.     fluticasone (FLONASE) 50 MCG/ACT nasal spray Place 2 sprays into both nostrils daily.     fluticasone (FLOVENT HFA) 110 MCG/ACT inhaler Inhale into the lungs.     gabapentin (NEURONTIN) 300 MG capsule Take 300 mg by mouth daily at 6 (six) AM.     ketoconazole (NIZORAL) 2 % cream 1 application Externally Twice a day as needed for 14 days     lisinopril-hydrochlorothiazide (PRINZIDE,ZESTORETIC) 20-12.5 MG per tablet Take 1 tablet by mouth daily.     meloxicam (MOBIC) 15 MG tablet Take 15 mg by mouth daily as needed for pain.     metFORMIN (GLUCOPHAGE-XR) 500 MG 24 hr tablet Take 2 tablets by mouth 2 (two)  times daily. Morning and night     metoprolol  succinate (TOPROL -XL) 25 MG 24 hr tablet Take 1 tablet (25 mg total) by mouth daily. 90 tablet 3   montelukast (SINGULAIR) 10 MG tablet Take 10 mg by mouth daily.     MOUNJARO 7.5 MG/0.5ML Pen Inject 7.5 mg into the skin once a week.     Multiple Vitamin (MULTIVITAMIN) tablet Take 1 tablet by mouth daily.     Olopatadine HCl 0.2 % SOLN Apply to eye.     ondansetron  (ZOFRAN -ODT) 4 MG disintegrating tablet Take 4 mg by mouth every 8 (eight) hours as needed.     oxyCODONE-acetaminophen (PERCOCET/ROXICET) 5-325 MG tablet 1 tablet every 4 (four) hours as needed for moderate pain (4-6).     Potassium Citrate 15 MEQ (1620 MG) TBCR Take 15 mEq by mouth daily.     rosuvastatin  (CRESTOR ) 40 MG tablet TAKE 1 TABLET BY MOUTH EVERY DAY 90 tablet 0   tadalafil (CIALIS) 5 MG tablet Take 5 mg by mouth daily.     Testosterone  (TESTOPEL) 75 MG PLLT Inject 75 mg into the skin every 4 (four) months.     testosterone cypionate (DEPOTESTOSTERONE CYPIONATE) 200 MG/ML injection 0.5 mL Intramuscular 0.5 mg/ml per week     cyclobenzaprine (FLEXERIL) 5 MG tablet Take 5 mg by mouth 2 (two) times daily as needed.     Fluticasone Furoate (ARNUITY ELLIPTA) 100 MCG/ACT AEPB Inhale 1 puff into the lungs daily at 6 (six) AM.     No current facility-administered medications for this visit.    Allergies  Allergen Reactions   Sulfamethoxazole-Trimethoprim Hives and Other (See Comments)    Social History   Socioeconomic History   Marital status: Married    Spouse name: Devere   Number of children: Not on file   Years of education: Not on file   Highest education level: Not on file  Occupational History   Occupation: Medical Coding  Tobacco Use   Smoking status: Never   Smokeless tobacco: Never  Vaping Use   Vaping status: Not on file  Substance and Sexual Activity   Alcohol use: Yes    Alcohol/week: 0.0 standard drinks of alcohol    Comment: rarely   Drug use: No   Sexual activity: Not on file  Other Topics Concern   Not on file  Social History Narrative   Right handed   Caffeine 1 cup avg daily.      Student/[employer], Working on [employer].  Married, no kids.     Pt lives with wife    Pt works    Social Drivers of Health   Tobacco Use: Low Risk (08/22/2024)   Patient History    Smoking Tobacco Use: Never    Smokeless Tobacco Use: Never    Passive Exposure: Not on file  Financial Resource Strain: Low Risk (10/22/2023)   Received from [facility]   Overall Financial Resource Strain (CARDIA)    Difficulty of Paying Living Expenses: Not hard at all  Food Insecurity: No Food Insecurity (10/22/2023)   [facility]   Epic    Within the past 12 months, you worried that your food would run out before you got the money to buy more.: Never true    Within the past 12 months, the food you bought  just didn't last and you didn't have money to get more.: Never true  Transportation Needs: No Transportation Needs (10/22/2023)   Received from [facility]  PRAPARE - Administrator, Civil Service (Medical): No    Lack of Transportation (Non-Medical): No  Physical Activity: Insufficiently Active (10/22/2023)   Received from [facility]   Exercise Vital Sign    On average, how many days per week do you engage in moderate to strenuous exercise (like a brisk walk)?: 3 days    On average, how many minutes do you engage in exercise at this level?: 20 min  Stress: No Stress Concern Present (10/22/2023)   Received from [facility] of Occupational Health - Occupational Stress Questionnaire    Feeling of Stress : Not at all  Social Connections: Moderately Integrated (10/22/2023)   Received from [facility]   Social Network    How would you rate your social network (family, work, friends)?: Adequate participation with social networks  Intimate Partner Violence: Not At Risk (10/22/2023)   Received from [facility]   HITS    Over the last 12 months how often did your partner physically hurt you?: Never    Over the last 12 months how often did your partner insult you or talk down to you?: Never    Over the last 12 months how often did your partner threaten you with physical harm?: Never    Over the last 12 months how often did your partner scream or curse at you?: Never  Depression (PHQ2-9): Not on file  Alcohol Screen: Not on file  Housing: Low Risk (10/22/2023)   Received from [facility]    In the last 12 months, was there a time when you were not able to pay the mortgage or rent on time?: No    In the past 12 months, how many times have you moved where you were living?: 0    At any time in the past 12 months, were you homeless or living in a shelter (including now)?: No  Utilities: Not At Risk (10/22/2023)   Received from [facility]  Utilities    Threatened with loss of utilities: No  Health Literacy: Not on file    Family History  Problem Relation Age of Onset   Diabetes Mellitus I Mother    CAD Mother    Cancer - Other Father        Liver   Sleep apnea Neg Hx     Review of Systems:  As stated in the HPI and otherwise negative.   BP 104/78   Pulse 81   Ht 5' 10 (1.778 m)   Wt 257 lb 14.4 oz (117 kg)   SpO2 95%   BMI 37.00 kg/m   Physical Examination: General: Well developed, well nourished, NAD  SKIN: warm, dry. Neuro: No focal deficits  Psychiatric: Mood and affect normal  Neck: No JVD Lungs:Clear bilaterally, no wheezes, rhonci, crackles Cardiovascular: Regular rate and rhythm. No murmurs, gallops or rubs. Abdomen:Soft.  Extremities: No lower extremity edema.    EKG:  EKG is ordered today. The ekg ordered today demonstrates  EKG Interpretation Date/Time:  Tuesday August 22 2024 08:51:01 EST Ventricular Rate:  78 PR Interval:  194 QRS Duration:  92 QT Interval:  376 QTC Calculation: 428 R Axis:   -28  Text Interpretation: Normal sinus rhythm Confirmed by Verlin Bruckner [id number]) on 08/22/2024 8:54:48 AM   Recent Labs: No results found for requested labs within last 365 days.   Lipid Panel    Component Value Date/Time   CHOL 113 11/05/2021  0759   TRIG 156 (H) 11/05/2021 0759   HDL 32 (L) 11/05/2021 0759   CHOLHDL 3.5 11/05/2021 0759   LDLCALC 54 11/05/2021 0759     Wt Readings from Last 3 Encounters:  08/22/24 257 lb 14.4 oz (117 kg)  10/14/23 259 lb 3.2 oz (117.6 kg)  08/06/23 258 lb (117 kg)    Assessment and Plan:   1. CAD without angina: Coronary CTA in December 2022 with minimal LAD plaque. No chest pain.  -Continue ASA, Crestor  and Toprol .   2. Non-ischemic Cardiomyopathy: Echo in 2023 with LVEF=50-55%.  -Continue Lisinopril and Toprol    3. Hyperlipidemia: LDL 60 in November 2025.  -Continue Crestor   Labs/ tests ordered today include:   Orders Placed This  Encounter  Procedures   EKG 12-Lead   Disposition:   F/U with me in 12 months  Signed, Lonni Cash, MD 08/22/2024 9:25 AM    [facility] [address] [place], [place], [place]  [address] Phone: [phone]; Fax: [phone]    "

## 2024-08-22 NOTE — Patient Instructions (Signed)

## 2024-09-05 ENCOUNTER — Telehealth: Payer: Self-pay | Admitting: Neurology

## 2024-09-05 NOTE — Telephone Encounter (Signed)
 LVM informing pt reschedule is needed MD out

## 2024-09-08 ENCOUNTER — Encounter: Payer: Self-pay | Admitting: Neurology

## 2024-10-19 ENCOUNTER — Ambulatory Visit: Admitting: Neurology

## 2024-12-12 ENCOUNTER — Ambulatory Visit: Admitting: Neurology
